# Patient Record
Sex: Female | Born: 1975 | Race: Black or African American | Hispanic: No | Marital: Married | Smoking: Never smoker
Health system: Southern US, Community
[De-identification: ages and names within clinical notes are randomized; demographics above are authoritative.]

## PROBLEM LIST (undated history)

## (undated) DIAGNOSIS — E119 Type 2 diabetes mellitus without complications: Secondary | ICD-10-CM

## (undated) DIAGNOSIS — G629 Polyneuropathy, unspecified: Secondary | ICD-10-CM

## (undated) DIAGNOSIS — E7800 Pure hypercholesterolemia, unspecified: Secondary | ICD-10-CM

## (undated) HISTORY — DX: Pure hypercholesterolemia, unspecified: E78.00

## (undated) HISTORY — PX: COLONOSCOPY: SHX174

---

## 1998-07-20 ENCOUNTER — Other Ambulatory Visit: Admission: RE | Admit: 1998-07-20 | Discharge: 1998-07-20 | Payer: Self-pay | Admitting: Family Medicine

## 2000-09-03 ENCOUNTER — Encounter: Admission: RE | Admit: 2000-09-03 | Discharge: 2000-09-03 | Payer: Self-pay | Admitting: Family Medicine

## 2000-09-03 ENCOUNTER — Encounter: Payer: Self-pay | Admitting: Family Medicine

## 2002-12-04 ENCOUNTER — Other Ambulatory Visit: Admission: RE | Admit: 2002-12-04 | Discharge: 2002-12-04 | Payer: Self-pay | Admitting: Family Medicine

## 2003-12-21 ENCOUNTER — Other Ambulatory Visit: Admission: RE | Admit: 2003-12-21 | Discharge: 2003-12-21 | Payer: Self-pay | Admitting: Family Medicine

## 2003-12-24 ENCOUNTER — Encounter: Admission: RE | Admit: 2003-12-24 | Discharge: 2003-12-24 | Payer: Self-pay | Admitting: Family Medicine

## 2005-03-29 ENCOUNTER — Other Ambulatory Visit: Admission: RE | Admit: 2005-03-29 | Discharge: 2005-03-29 | Payer: Self-pay | Admitting: Family Medicine

## 2006-07-04 ENCOUNTER — Other Ambulatory Visit: Admission: RE | Admit: 2006-07-04 | Discharge: 2006-07-04 | Payer: Self-pay | Admitting: Family Medicine

## 2007-07-18 ENCOUNTER — Other Ambulatory Visit: Admission: RE | Admit: 2007-07-18 | Discharge: 2007-07-18 | Payer: Self-pay | Admitting: Family Medicine

## 2008-07-23 ENCOUNTER — Other Ambulatory Visit: Admission: RE | Admit: 2008-07-23 | Discharge: 2008-07-23 | Payer: Self-pay | Admitting: Family Medicine

## 2010-05-31 ENCOUNTER — Other Ambulatory Visit (HOSPITAL_COMMUNITY)
Admission: RE | Admit: 2010-05-31 | Discharge: 2010-05-31 | Disposition: A | Discharge status: Home / Self Care | Payer: Self-pay | Source: Ambulatory Visit | Attending: Obstetrics and Gynecology | Admitting: Obstetrics and Gynecology

## 2010-05-31 ENCOUNTER — Other Ambulatory Visit: Payer: Self-pay | Admitting: Obstetrics and Gynecology

## 2010-05-31 DIAGNOSIS — Z01419 Encounter for gynecological examination (general) (routine) without abnormal findings: Secondary | ICD-10-CM | POA: Insufficient documentation

## 2012-08-30 ENCOUNTER — Other Ambulatory Visit: Payer: Self-pay | Admitting: Obstetrics and Gynecology

## 2012-08-30 ENCOUNTER — Other Ambulatory Visit (HOSPITAL_COMMUNITY)
Admission: RE | Admit: 2012-08-30 | Discharge: 2012-08-30 | Disposition: A | Discharge status: Home / Self Care | Payer: Self-pay | Source: Ambulatory Visit | Attending: Obstetrics and Gynecology | Admitting: Obstetrics and Gynecology

## 2012-08-30 DIAGNOSIS — Z01419 Encounter for gynecological examination (general) (routine) without abnormal findings: Secondary | ICD-10-CM | POA: Insufficient documentation

## 2012-08-30 DIAGNOSIS — Z1151 Encounter for screening for human papillomavirus (HPV): Secondary | ICD-10-CM | POA: Insufficient documentation

## 2013-05-06 ENCOUNTER — Ambulatory Visit: Payer: PRIVATE HEALTH INSURANCE | Admitting: Diagnostic Neuroimaging

## 2013-05-09 ENCOUNTER — Encounter: Payer: Self-pay | Admitting: Diagnostic Neuroimaging

## 2013-05-09 ENCOUNTER — Ambulatory Visit (INDEPENDENT_AMBULATORY_CARE_PROVIDER_SITE_OTHER): Payer: PRIVATE HEALTH INSURANCE | Admitting: Diagnostic Neuroimaging

## 2013-05-09 ENCOUNTER — Encounter (INDEPENDENT_AMBULATORY_CARE_PROVIDER_SITE_OTHER): Payer: Self-pay

## 2013-05-09 ENCOUNTER — Encounter (INDEPENDENT_AMBULATORY_CARE_PROVIDER_SITE_OTHER): Payer: Self-pay | Admitting: Radiology

## 2013-05-09 VITALS — BP 93/58 | HR 85 | Ht 63.0 in | Wt 230.0 lb

## 2013-05-09 DIAGNOSIS — R200 Anesthesia of skin: Secondary | ICD-10-CM

## 2013-05-09 DIAGNOSIS — Z0289 Encounter for other administrative examinations: Secondary | ICD-10-CM

## 2013-05-09 DIAGNOSIS — R209 Unspecified disturbances of skin sensation: Secondary | ICD-10-CM

## 2013-05-09 MED ORDER — GABAPENTIN 300 MG PO CAPS: 300.0000 mg | ORAL_CAPSULE | Freq: Two times a day (BID) | ORAL | Status: DC

## 2013-05-09 NOTE — Progress Notes (Signed)
[facility]  PATIENT: Barbara [name] DOB: February 11, 1976  REFERRING CLINICIAN: Jonette EvaLazo HISTORY FROM: patient  REASON FOR VISIT: new consult   HISTORICAL  CHIEF COMPLAINT:  Chief Complaint  Patient presents with  . Numbness    swelling, both hands    HISTORY OF PRESENT ILLNESS:   38 year old left-handed female here for evaluation of bilateral hand numbness for past 2 years (since 2013). Patient describes numbness and tingling, fingers pulling sensation in her left. Right hand. Her thumb and index finger are primarily affected. Symptoms worse when she is working on the phone or sometimes when she sleeps and wakes her up. She denies any proximal upper extremity pain or numbness. No neck pain. No headaches or lower extremities symptoms. Her patient had nerve conduction study done in [place] which I reviewed. 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 Needle EMG was not performed. Reviewing the raw data, sensory nerve response slowing is noted in bilateral median and bilateral radial sensory responses. Prolonged distal latency noted in bilateral median and bilateral ulnar motor responses.  Patient denies any significant trauma or triggering factor.  REVIEW OF SYSTEMS: Full 14 system review of systems performed and notable only for numbness skin moles.  ALLERGIES: No Known Allergies  HOME MEDICATIONS: No outpatient prescriptions prior to visit.   No facility-administered medications prior to visit.    PAST MEDICAL HISTORY: Past Medical History  Diagnosis Date  . High cholesterol     PAST SURGICAL HISTORY: History reviewed. No pertinent past surgical history.  FAMILY HISTORY: Family History  Problem Relation Age of Onset  . Heart disease Mother   . Breast cancer Mother   . Heart disease Father     SOCIAL HISTORY:  History   Social History  . Marital Status: Married    Spouse Name: Earl LitesGregory   Number of Children: 0  . Years of Education: BA   Occupational History  . other     [employer]   Social History Main Topics  . Smoking status: Never Smoker   . Smokeless tobacco: Never Used  . Alcohol Use: Yes     Comment: twice a month; occasionally  . Drug Use: No  . Sexual Activity: Not on file   Other Topics Concern  . Not on file   Social History Narrative   Patient lives at home with spouse.   Caffeine Use: 16oz soda maybe two or three times a week     PHYSICAL EXAM  Filed Vitals:   05/09/13 0952  BP: 93/58  Pulse: 85  Height: 5\' 3"  (1.6 m)  Weight: 230 lb (104.327 kg)    Not recorded    Body mass index is 40.75 kg/(m^2).  GENERAL EXAM: Patient is in no distress; well developed, nourished and groomed; neck is supple  CARDIOVASCULAR: Regular rate and rhythm, no murmurs, no carotid bruits  NEUROLOGIC: MENTAL STATUS: awake, alert, oriented to person, place and time, recent and remote memory intact, normal attention and concentration, language fluent, comprehension intact, naming intact, fund of knowledge appropriate CRANIAL NERVE: no papilledema on fundoscopic exam, pupils equal and reactive to light, visual fields full to confrontation, extraocular muscles intact, no nystagmus, facial sensation and strength symmetric, hearing intact, palate elevates symmetrically, uvula midline, shoulder shrug symmetric, tongue midline. MOTOR: normal bulk and tone, full strength in the BUE, BLE SENSORY: normal and symmetric to light touch, temperature, vibration; EXCEPT DECR PP IN LEFT DIGIT 2. POSITIVE PHALEN'S BILATERALLY. NEG TINEL'S.  COORDINATION: finger-nose-finger, fine finger movements normal REFLEXES: deep tendon reflexes present and symmetric; TRACE AT ANKLES. GAIT/STATION: narrow based gait; able to walk on toes, heels and tandem; romberg is negative    DIAGNOSTIC DATA (LABS, IMAGING, TESTING) - I reviewed patient records, labs, notes, testing and  imaging myself where available.  No results found for this basename: WBC, HGB, HCT, MCV, PLT   No results found for this basename: na, k, cl, co2, glucose, bun, creatinine, calcium, prot, albumin, ast, alt, alkphos, bilitot, gfrnonaa, gfraa   No results found for this basename: CHOL, HDL, LDLCALC, LDLDIRECT, TRIG, CHOLHDL   No results found for this basename: HGBA1C   No results found for this basename: VITAMINB12   No results found for this basename: TSH      ASSESSMENT AND PLAN  38 y.o. year old female here with bilateral hand numbness, suspicious for carpal tunnel syndrome. Outside nerve conduction study raises possibility of underlying polyneuropathy. However the interpretation and raw data are somewhat inconsistent.  Ddx: carpal tunnel syndrome vs polyneuropathy  PLAN: - EMG/NCS today (worked into schedule)  Orders Placed This Encounter  Procedures  . NCV with EMG(electromyography)   Return in about 3 months (around 08/06/2013) for with Heide Guile or Penumalli.  Suanne Marker, MD 05/09/2013, 11:18 AM Certified in Neurology, Neurophysiology and Neuroimaging  [facility] [address] [place], [place] [address] [phone]    ADDENDUM: EMG nerve conduction study was performed and confirms bilateral carpal tunnel syndrome without underlying polyneuropathy. I recommended patient try response at nighttime for the next few months. If this does not help, then we may set up referral to hand specialist for steroid injections and possible carpal tunnel release surgery. Patient agrees with plan. Follow up in 3 months.  Suanne Marker, MD 05/09/2013, 2:56 PM Certified in Neurology, Neurophysiology and Neuroimaging  [facility] [address], [address] [place], [place] [address] [phone]

## 2013-05-09 NOTE — Patient Instructions (Signed)
Try gabapentin 300mg  at bedtime for 2 weeks; can increase to twice a day if not helping.

## 2013-05-09 NOTE — Procedures (Signed)
   [facility]  NCS (NERVE CONDUCTION STUDY) WITH EMG (ELECTROMYOGRAPHY) REPORT   STUDY DATE: 05/09/13 PATIENT NAME: Barbara [name] DOB: [birth year] MRN: [id number]  ORDERING CLINICIAN: Joycelyn SchmidVikram Penumalli, MD   TECHNOLOGIST: Kaylyn LimSue Fox  ELECTROMYOGRAPHER: Glenford BayleyVikram R. Penumalli, MD  CLINICAL INFORMATION: 38 year old female with left greater than right hand numbness. Abnormal outside nerve conduction study. Evaluate for carpal tunnel syndrome vs polyneuropathy.  FINDINGS: NERVE CONDUCTION STUDY: Bilateral median motor responses have prolonged distal latencies (right 4.7 ms, left 4.3 ms, normal less than or equal to 4.2 ms), normal amplitudes, normal conduction velocities and normal F-wave latencies. Bilateral ulnar, peroneal, tibial motor responses have normal distal latencies, amplitudes, conduction velocities and F-wave latencies. Bilateral median, ulnar, sural sensory responses are normal. Bilateral median mixed nerve transcarpal responses have normal amplitudes and slow conduction velocities (right 42 m/s, left 46 m/s, normal greater than or equal to 48 m/s). Bilateral ulnar nerve transcarpal responses are normal.  NEEDLE ELECTROMYOGRAPHY: Needle examination of the left upper extremity (deltoid, biceps, triceps, flexor carpi radialis, first dorsal interosseous) demonstrates no abnormal spontaneous activity at rest and normal motor unit recruitment on exertion.  IMPRESSION:  Abnormal study demonstrating: 1. Mild bilateral median neuropathies at the wrists consistent with mild bilateral carpal tunnel syndrome. 2. No electrodiagnostic evidence of underlying, diffuse large fiber neuropathy.   INTERPRETING PHYSICIAN:  Suanne MarkerVIKRAM R. PENUMALLI, MD Certified in Neurology, Neurophysiology and Neuroimaging  [facility] [address], [address] [place], [place] [id number] [phone]

## 2013-05-12 ENCOUNTER — Encounter: Payer: PRIVATE HEALTH INSURANCE | Admitting: Diagnostic Neuroimaging

## 2013-09-05 ENCOUNTER — Other Ambulatory Visit (HOSPITAL_COMMUNITY)
Admission: RE | Admit: 2013-09-05 | Discharge: 2013-09-05 | Disposition: A | Discharge status: Home / Self Care | Payer: PRIVATE HEALTH INSURANCE | Source: Ambulatory Visit | Attending: Obstetrics and Gynecology | Admitting: Obstetrics and Gynecology

## 2013-09-05 ENCOUNTER — Other Ambulatory Visit: Payer: Self-pay | Admitting: Obstetrics and Gynecology

## 2013-09-05 DIAGNOSIS — Z1231 Encounter for screening mammogram for malignant neoplasm of breast: Secondary | ICD-10-CM

## 2013-09-05 DIAGNOSIS — Z113 Encounter for screening for infections with a predominantly sexual mode of transmission: Secondary | ICD-10-CM | POA: Insufficient documentation

## 2013-09-05 DIAGNOSIS — Z124 Encounter for screening for malignant neoplasm of cervix: Secondary | ICD-10-CM | POA: Insufficient documentation

## 2013-09-08 LAB — CYTOLOGY - PAP

## 2013-09-12 ENCOUNTER — Ambulatory Visit: Payer: Self-pay

## 2014-09-25 ENCOUNTER — Other Ambulatory Visit (HOSPITAL_COMMUNITY)
Admission: RE | Admit: 2014-09-25 | Discharge: 2014-09-25 | Disposition: A | Discharge status: Home / Self Care | Payer: PRIVATE HEALTH INSURANCE | Source: Ambulatory Visit | Attending: Obstetrics and Gynecology | Admitting: Obstetrics and Gynecology

## 2014-09-25 ENCOUNTER — Other Ambulatory Visit: Payer: Self-pay | Admitting: Obstetrics and Gynecology

## 2014-09-25 DIAGNOSIS — Z01419 Encounter for gynecological examination (general) (routine) without abnormal findings: Secondary | ICD-10-CM | POA: Diagnosis not present

## 2014-09-28 LAB — CYTOLOGY - PAP

## 2014-10-09 ENCOUNTER — Other Ambulatory Visit: Payer: Self-pay

## 2014-10-09 DIAGNOSIS — Z1231 Encounter for screening mammogram for malignant neoplasm of breast: Secondary | ICD-10-CM

## 2014-10-09 DIAGNOSIS — Z803 Family history of malignant neoplasm of breast: Secondary | ICD-10-CM

## 2014-10-16 ENCOUNTER — Ambulatory Visit
Admission: RE | Admit: 2014-10-16 | Discharge: 2014-10-16 | Disposition: A | Discharge status: Home / Self Care | Payer: PRIVATE HEALTH INSURANCE | Source: Ambulatory Visit

## 2014-10-16 DIAGNOSIS — Z1231 Encounter for screening mammogram for malignant neoplasm of breast: Secondary | ICD-10-CM

## 2014-10-16 DIAGNOSIS — Z803 Family history of malignant neoplasm of breast: Secondary | ICD-10-CM

## 2015-09-24 ENCOUNTER — Other Ambulatory Visit (HOSPITAL_COMMUNITY)
Admission: RE | Admit: 2015-09-24 | Discharge: 2015-09-24 | Disposition: A | Discharge status: Home / Self Care | Payer: PRIVATE HEALTH INSURANCE | Source: Ambulatory Visit | Attending: Obstetrics and Gynecology | Admitting: Obstetrics and Gynecology

## 2015-09-24 ENCOUNTER — Other Ambulatory Visit: Payer: Self-pay | Admitting: Obstetrics and Gynecology

## 2015-09-24 DIAGNOSIS — Z1151 Encounter for screening for human papillomavirus (HPV): Secondary | ICD-10-CM | POA: Insufficient documentation

## 2015-09-24 DIAGNOSIS — Z01419 Encounter for gynecological examination (general) (routine) without abnormal findings: Secondary | ICD-10-CM | POA: Diagnosis present

## 2015-09-24 DIAGNOSIS — Z113 Encounter for screening for infections with a predominantly sexual mode of transmission: Secondary | ICD-10-CM | POA: Diagnosis present

## 2015-09-29 LAB — CYTOLOGY - PAP

## 2016-02-28 DIAGNOSIS — R7303 Prediabetes: Secondary | ICD-10-CM | POA: Diagnosis not present

## 2016-02-28 DIAGNOSIS — Z23 Encounter for immunization: Secondary | ICD-10-CM | POA: Diagnosis not present

## 2016-05-14 DIAGNOSIS — A5901 Trichomonal vulvovaginitis: Secondary | ICD-10-CM | POA: Diagnosis not present

## 2016-05-14 DIAGNOSIS — N898 Other specified noninflammatory disorders of vagina: Secondary | ICD-10-CM | POA: Diagnosis not present

## 2016-09-12 DIAGNOSIS — R7303 Prediabetes: Secondary | ICD-10-CM | POA: Diagnosis not present

## 2016-09-12 DIAGNOSIS — Z0001 Encounter for general adult medical examination with abnormal findings: Secondary | ICD-10-CM | POA: Diagnosis not present

## 2016-09-12 DIAGNOSIS — Z13220 Encounter for screening for lipoid disorders: Secondary | ICD-10-CM | POA: Diagnosis not present

## 2016-10-06 ENCOUNTER — Other Ambulatory Visit: Payer: Self-pay | Admitting: Obstetrics and Gynecology

## 2016-10-06 DIAGNOSIS — B009 Herpesviral infection, unspecified: Secondary | ICD-10-CM | POA: Diagnosis not present

## 2016-10-06 DIAGNOSIS — Z113 Encounter for screening for infections with a predominantly sexual mode of transmission: Secondary | ICD-10-CM | POA: Diagnosis not present

## 2016-10-06 DIAGNOSIS — Z01419 Encounter for gynecological examination (general) (routine) without abnormal findings: Secondary | ICD-10-CM | POA: Diagnosis not present

## 2016-10-06 DIAGNOSIS — Z1231 Encounter for screening mammogram for malignant neoplasm of breast: Secondary | ICD-10-CM

## 2016-10-16 ENCOUNTER — Encounter: Payer: Self-pay | Admitting: Radiology

## 2016-10-16 ENCOUNTER — Ambulatory Visit
Admission: RE | Admit: 2016-10-16 | Discharge: 2016-10-16 | Disposition: A | Discharge status: Home / Self Care | Payer: BLUE CROSS/BLUE SHIELD | Source: Ambulatory Visit | Attending: Obstetrics and Gynecology | Admitting: Obstetrics and Gynecology

## 2016-10-16 DIAGNOSIS — Z1231 Encounter for screening mammogram for malignant neoplasm of breast: Secondary | ICD-10-CM

## 2016-12-13 DIAGNOSIS — Z23 Encounter for immunization: Secondary | ICD-10-CM | POA: Diagnosis not present

## 2016-12-13 DIAGNOSIS — E119 Type 2 diabetes mellitus without complications: Secondary | ICD-10-CM | POA: Diagnosis not present

## 2017-01-01 DIAGNOSIS — N9089 Other specified noninflammatory disorders of vulva and perineum: Secondary | ICD-10-CM | POA: Diagnosis not present

## 2017-03-21 DIAGNOSIS — E119 Type 2 diabetes mellitus without complications: Secondary | ICD-10-CM | POA: Diagnosis not present

## 2017-09-04 DIAGNOSIS — W57XXXA Bitten or stung by nonvenomous insect and other nonvenomous arthropods, initial encounter: Secondary | ICD-10-CM | POA: Diagnosis not present

## 2017-09-04 DIAGNOSIS — Y929 Unspecified place or not applicable: Secondary | ICD-10-CM | POA: Diagnosis not present

## 2017-09-04 DIAGNOSIS — S70361A Insect bite (nonvenomous), right thigh, initial encounter: Secondary | ICD-10-CM | POA: Diagnosis not present

## 2017-09-06 DIAGNOSIS — L810 Postinflammatory hyperpigmentation: Secondary | ICD-10-CM | POA: Diagnosis not present

## 2017-09-06 DIAGNOSIS — L853 Xerosis cutis: Secondary | ICD-10-CM | POA: Diagnosis not present

## 2017-09-07 ENCOUNTER — Other Ambulatory Visit: Payer: Self-pay | Admitting: Obstetrics and Gynecology

## 2017-09-07 DIAGNOSIS — Z1231 Encounter for screening mammogram for malignant neoplasm of breast: Secondary | ICD-10-CM

## 2017-10-08 DIAGNOSIS — Z01419 Encounter for gynecological examination (general) (routine) without abnormal findings: Secondary | ICD-10-CM | POA: Diagnosis not present

## 2017-10-18 ENCOUNTER — Ambulatory Visit
Admission: RE | Admit: 2017-10-18 | Discharge: 2017-10-18 | Disposition: A | Discharge status: Home / Self Care | Payer: BLUE CROSS/BLUE SHIELD | Source: Ambulatory Visit | Attending: Obstetrics and Gynecology | Admitting: Obstetrics and Gynecology

## 2017-10-18 DIAGNOSIS — Z1231 Encounter for screening mammogram for malignant neoplasm of breast: Secondary | ICD-10-CM | POA: Diagnosis not present

## 2018-04-23 DIAGNOSIS — J0120 Acute ethmoidal sinusitis, unspecified: Secondary | ICD-10-CM | POA: Diagnosis not present

## 2018-10-30 DIAGNOSIS — E119 Type 2 diabetes mellitus without complications: Secondary | ICD-10-CM | POA: Diagnosis not present

## 2018-10-30 DIAGNOSIS — Z0001 Encounter for general adult medical examination with abnormal findings: Secondary | ICD-10-CM | POA: Diagnosis not present

## 2018-10-30 DIAGNOSIS — Z13220 Encounter for screening for lipoid disorders: Secondary | ICD-10-CM | POA: Diagnosis not present

## 2018-10-30 DIAGNOSIS — Z23 Encounter for immunization: Secondary | ICD-10-CM | POA: Diagnosis not present

## 2018-11-22 ENCOUNTER — Other Ambulatory Visit (HOSPITAL_COMMUNITY)
Admission: RE | Admit: 2018-11-22 | Discharge: 2018-11-22 | Disposition: A | Discharge status: Home / Self Care | Payer: BLUE CROSS/BLUE SHIELD | Source: Ambulatory Visit | Attending: Obstetrics and Gynecology | Admitting: Obstetrics and Gynecology

## 2018-11-22 ENCOUNTER — Other Ambulatory Visit: Payer: Self-pay | Admitting: Obstetrics and Gynecology

## 2018-11-22 DIAGNOSIS — Z01419 Encounter for gynecological examination (general) (routine) without abnormal findings: Secondary | ICD-10-CM | POA: Insufficient documentation

## 2018-11-22 DIAGNOSIS — Z1231 Encounter for screening mammogram for malignant neoplasm of breast: Secondary | ICD-10-CM

## 2018-11-27 LAB — CYTOLOGY - PAP
Diagnosis: NEGATIVE
HPV: NOT DETECTED

## 2019-01-09 ENCOUNTER — Ambulatory Visit
Admission: RE | Admit: 2019-01-09 | Discharge: 2019-01-09 | Disposition: A | Discharge status: Home / Self Care | Payer: BC Managed Care – PPO | Source: Ambulatory Visit | Attending: Obstetrics and Gynecology | Admitting: Obstetrics and Gynecology

## 2019-01-09 ENCOUNTER — Other Ambulatory Visit: Payer: Self-pay

## 2019-01-09 DIAGNOSIS — Z1231 Encounter for screening mammogram for malignant neoplasm of breast: Secondary | ICD-10-CM

## 2019-01-16 DIAGNOSIS — E7800 Pure hypercholesterolemia, unspecified: Secondary | ICD-10-CM | POA: Diagnosis not present

## 2019-05-23 DIAGNOSIS — Z03818 Encounter for observation for suspected exposure to other biological agents ruled out: Secondary | ICD-10-CM | POA: Diagnosis not present

## 2019-05-23 DIAGNOSIS — Z20828 Contact with and (suspected) exposure to other viral communicable diseases: Secondary | ICD-10-CM | POA: Diagnosis not present

## 2019-07-01 DIAGNOSIS — N6081 Other benign mammary dysplasias of right breast: Secondary | ICD-10-CM | POA: Diagnosis not present

## 2019-07-01 DIAGNOSIS — L732 Hidradenitis suppurativa: Secondary | ICD-10-CM | POA: Diagnosis not present

## 2019-07-09 DIAGNOSIS — L732 Hidradenitis suppurativa: Secondary | ICD-10-CM | POA: Diagnosis not present

## 2019-10-15 DIAGNOSIS — L732 Hidradenitis suppurativa: Secondary | ICD-10-CM | POA: Diagnosis not present

## 2019-12-01 DIAGNOSIS — Z01419 Encounter for gynecological examination (general) (routine) without abnormal findings: Secondary | ICD-10-CM | POA: Diagnosis not present

## 2020-02-18 ENCOUNTER — Other Ambulatory Visit: Payer: Self-pay | Admitting: Obstetrics and Gynecology

## 2020-02-18 DIAGNOSIS — Z1231 Encounter for screening mammogram for malignant neoplasm of breast: Secondary | ICD-10-CM

## 2020-02-19 ENCOUNTER — Ambulatory Visit
Admission: RE | Admit: 2020-02-19 | Discharge: 2020-02-19 | Disposition: A | Discharge status: Home / Self Care | Payer: BC Managed Care – PPO | Source: Ambulatory Visit | Attending: Obstetrics and Gynecology | Admitting: Obstetrics and Gynecology

## 2020-02-19 ENCOUNTER — Other Ambulatory Visit: Payer: Self-pay

## 2020-02-19 DIAGNOSIS — Z1231 Encounter for screening mammogram for malignant neoplasm of breast: Secondary | ICD-10-CM

## 2020-03-26 DIAGNOSIS — R059 Cough, unspecified: Secondary | ICD-10-CM | POA: Diagnosis not present

## 2020-03-26 DIAGNOSIS — Z1152 Encounter for screening for COVID-19: Secondary | ICD-10-CM | POA: Diagnosis not present

## 2020-09-16 DIAGNOSIS — E119 Type 2 diabetes mellitus without complications: Secondary | ICD-10-CM | POA: Diagnosis not present

## 2020-09-16 DIAGNOSIS — Z13220 Encounter for screening for lipoid disorders: Secondary | ICD-10-CM | POA: Diagnosis not present

## 2020-09-16 DIAGNOSIS — E559 Vitamin D deficiency, unspecified: Secondary | ICD-10-CM | POA: Diagnosis not present

## 2020-09-16 DIAGNOSIS — Z0000 Encounter for general adult medical examination without abnormal findings: Secondary | ICD-10-CM | POA: Diagnosis not present

## 2020-12-28 DIAGNOSIS — E119 Type 2 diabetes mellitus without complications: Secondary | ICD-10-CM | POA: Diagnosis not present

## 2021-01-04 DIAGNOSIS — Z13220 Encounter for screening for lipoid disorders: Secondary | ICD-10-CM | POA: Diagnosis not present

## 2021-01-04 DIAGNOSIS — E119 Type 2 diabetes mellitus without complications: Secondary | ICD-10-CM | POA: Diagnosis not present

## 2021-01-10 DIAGNOSIS — Z01419 Encounter for gynecological examination (general) (routine) without abnormal findings: Secondary | ICD-10-CM | POA: Diagnosis not present

## 2021-03-25 ENCOUNTER — Other Ambulatory Visit: Payer: Self-pay | Admitting: Obstetrics and Gynecology

## 2021-03-25 DIAGNOSIS — Z1231 Encounter for screening mammogram for malignant neoplasm of breast: Secondary | ICD-10-CM

## 2021-03-28 ENCOUNTER — Ambulatory Visit
Admission: RE | Admit: 2021-03-28 | Discharge: 2021-03-28 | Disposition: A | Discharge status: Home / Self Care | Payer: BC Managed Care – PPO | Source: Ambulatory Visit | Attending: Obstetrics and Gynecology | Admitting: Obstetrics and Gynecology

## 2021-03-28 DIAGNOSIS — Z1231 Encounter for screening mammogram for malignant neoplasm of breast: Secondary | ICD-10-CM | POA: Diagnosis not present

## 2021-05-08 DIAGNOSIS — M545 Low back pain, unspecified: Secondary | ICD-10-CM | POA: Diagnosis not present

## 2021-07-08 DIAGNOSIS — K5730 Diverticulosis of large intestine without perforation or abscess without bleeding: Secondary | ICD-10-CM | POA: Diagnosis not present

## 2021-07-08 DIAGNOSIS — K648 Other hemorrhoids: Secondary | ICD-10-CM | POA: Diagnosis not present

## 2021-07-08 DIAGNOSIS — Z800 Family history of malignant neoplasm of digestive organs: Secondary | ICD-10-CM | POA: Diagnosis not present

## 2021-07-08 DIAGNOSIS — K635 Polyp of colon: Secondary | ICD-10-CM | POA: Diagnosis not present

## 2021-07-08 DIAGNOSIS — Z1211 Encounter for screening for malignant neoplasm of colon: Secondary | ICD-10-CM | POA: Diagnosis not present

## 2021-09-15 DIAGNOSIS — M545 Low back pain, unspecified: Secondary | ICD-10-CM | POA: Diagnosis not present
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
## 2021-09-21 ENCOUNTER — Encounter: Payer: Self-pay | Admitting: Physical Therapy

## 2021-09-21 ENCOUNTER — Ambulatory Visit: Payer: BC Managed Care – PPO | Attending: Family Medicine | Admitting: Physical Therapy

## 2021-09-21 DIAGNOSIS — R252 Cramp and spasm: Secondary | ICD-10-CM | POA: Insufficient documentation

## 2021-09-21 DIAGNOSIS — M5459 Other low back pain: Secondary | ICD-10-CM | POA: Insufficient documentation

## 2021-10-04 ENCOUNTER — Ambulatory Visit: Payer: BC Managed Care – PPO

## 2021-10-04 DIAGNOSIS — M5459 Other low back pain: Secondary | ICD-10-CM

## 2021-10-04 DIAGNOSIS — R252 Cramp and spasm: Secondary | ICD-10-CM | POA: Diagnosis not present

## 2021-10-04 NOTE — Therapy (Signed)
OUTPATIENT PHYSICAL THERAPY TREATMENT NOTE   Patient Name: Barbara [name] MRN: [id number] DOB:09-09-1975, 46 y.o., female Today's Date: 10/04/2021  PCP: Dr. Duane Lope REFERRING PROVIDER: Dr. Duane Lope  END OF SESSION:   PT End of Session - 10/04/21 1027     Visit Number 2    Number of Visits 12    Date for PT Re-Evaluation 11/02/21    Authorization Type BCBS    PT Start Time 0940    PT Stop Time 1020    PT Time Calculation (min) 40 min    Activity Tolerance Patient tolerated treatment well    Behavior During Therapy [facility] for tasks assessed/performed             Past Medical History:  Diagnosis Date   High cholesterol    History reviewed. No pertinent surgical history. There are no problems to display for this patient.   REFERRING DIAG: Low back pain   THERAPY DIAG:  Other low back pain  Cramp and spasm  Rationale for Evaluation and Treatment Rehabilitation  SUBJECTIVE:                                                                                                                                                                                    SUBJECTIVE STATEMENT: Pt reports her L low back is about the same. Pain is limited to the L low back, not extending down the R lateral hip   PERTINENT HISTORY:  No other episodes of back pain   Diabetes   PAIN:  Are you having pain? Yes: NPRS scale: 1-2/10 Pain location: L low lumbar , waist  wrapping to L hip ant  Pain description: dull ache Aggravating factors: unsure  Relieving factors: lay down, wait, take OTC     PRECAUTIONS: None   WEIGHT BEARING RESTRICTIONS No   FALLS:  Has patient fallen in last 6 months? 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    PLOF: Independent   PATIENT GOALS : Pt wants to  be able to prevent this from happening again.  I just don't want to hurt.      OBJECTIVE: (objective measures completed at initial evaluation unless otherwise dated)   DIAGNOSTIC FINDINGS:  No XR or MRI    PATIENT SURVEYS:  FOTO 47%   SCREENING FOR RED FLAGS: Bowel or bladder incontinence: No Spinal tumors: No Cauda equina syndrome: No Compression fracture: No Abdominal aneurysm: No   COGNITION:  Overall cognitive status: Within functional limits for tasks assessed                          SENSATION: WFL     POSTURE: rounded shoulders   PALPATION: Pain along L L4-L5 and wrapping to L hip, pelvis  High L hip    LUMBAR ROM:    Active  A/PROM  eval  Flexion WNL   Extension 75% pain on L   Right lateral flexion WFL   Left lateral flexion Pain but WFL   Right rotation Pain min   Left rotation Inc pain, WFL    (Blank rows = not tested)   LOWER EXTREMITY ROM:      Active  Right eval Left eval  Hip flexion WNL WNL   Hip extension      Hip abduction      Hip adduction      Hip internal rotation Tight  tight  Hip external rotation No pain  Painful   Knee flexion      Knee extension      Ankle dorsiflexion      Ankle plantarflexion      Ankle inversion      Ankle eversion       (Blank rows = not tested)   LOWER EXTREMITY MMT:     MMT Right eval Left eval  Hip flexion WNL 4+, Pain   Hip extension      Hip abduction   4+  Hip adduction      Hip internal rotation      Hip external rotation      Knee flexion 4+ 4+  Knee extension 5 5  Ankle dorsiflexion      Ankle plantarflexion      Ankle inversion      Ankle eversion       (Blank rows = not tested)   LUMBAR SPECIAL TESTS:  Straight leg raise test: Negative and Slump test: Negative Pain with LLE SLR      FUNCTIONAL TESTS:  5 times sit to stand: NT today    GAIT: Distance walked: 150 Assistive device utilized: None Level of assistance: Complete Independence Comments: no deviations         TODAY'S TREATMENT  [facility] Adult PT Treatment:                                                DATE: 10/04/21 Therapeutic Exercise: QL stretch in standing 2x20 L stretch in standing and for L side Seated forward and lateral streches c Swiss ball x4 20" Bridging x10 10 sec Hip abd x10 5 sec BluTB Manual Therapy: STM c MTPR to the L low back paraspinals and QL Self Care: Instruction in tennis ball massage at wall. Pt completed correctly  Eval Treatment: PT eval and treatment plan established, HEP      PATIENT EDUCATION:  Education details: PT, differential diag, QL muscle (quadratus lumborum), HEP, POC, Dry needling  Person educated: Patient Education method: Explanation, Verbal cues, and Handouts Education comprehension: verbalized understanding and returned demonstration     HOME EXERCISE PROGRAM: Access Code: [email or web address] URL: [email or web address] Date: 10/04/2021 Prepared by: Gar Ponto  Exercises - Supine Bridge  - 1-2 x daily - 7 x weekly - 1 sets - 10 reps - 10 hold - Hooklying Clamshell with Resistance  -  1-2 x daily - 7 x weekly - 1 sets - 10 reps - 5 hold - Seated Flexion Stretch with Swiss Ball  - 2 x daily - 7 x weekly - 1 sets - 3 reps - 20 hold - Seated Thoracic Flexion and Rotation with Swiss Ball  - 2 x daily - 7 x weekly - 1 sets - 3 reps - 20 hold   ASSESSMENT:   CLINICAL IMPRESSION: Pt was completed to low back flexibility with emphasis of the L low back. Pt reported STM, MTPR, and self tennis ball massage felt good to her L low back. Pt reports being consistent with her HEP which helps to ease the discomfort temporarily. Additionally, low back and hip strengthening therex were started. Pt tolerated the session without adverse effects. Pt will continue to benefit from skilled PT to address deficits to optimize function with less pain. Pt notes she is going to have a massage to her low back later today.   OBJECTIVE IMPAIRMENTS decreased  mobility, decreased ROM, decreased strength, increased fascial restrictions, increased muscle spasms, impaired flexibility, postural dysfunction, obesity, and pain.    ACTIVITY LIMITATIONS standing   PARTICIPATION LIMITATIONS: community activity   PERSONAL FACTORS Fitness are also affecting patient's functional outcome.    REHAB POTENTIAL: Excellent   CLINICAL DECISION MAKING: Stable/uncomplicated   EVALUATION COMPLEXITY: Low     GOALS:   LONG TERM GOALS: Target date: 11/02/2021   Pt will be I with HEP for trunk mobility, core strength  Baseline:  Goal status: INITIAL   2.  Pt will be able to stand, walk and move as needed without pain aggravation  Baseline:  Goal status: INITIAL   3.  Pt will be able to understand body mechanics and posture for more challenging home and work tasks.  Baseline:  Goal status: INITIAL   4.  Pt will walk 3 days a week for exercise (or exercise of her choice) in order to improve overall health Baseline:  Goal status: INITIAL   5.  FOTO score will improve to 68% or better upon discharge  Baseline:  Goal status: INITIAL     PLAN: PT FREQUENCY: 1x/week   PT DURATION: 6 weeks   PLANNED INTERVENTIONS: Therapeutic exercises, Therapeutic activity, Neuromuscular re-education, Balance training, Gait training, Patient/Family education, Joint mobilization, Electrical stimulation, Spinal mobilization, Cryotherapy, Moist heat, Manual therapy, and Re-evaluation.   PLAN FOR NEXT SESSION: check HEP, dry needling/manual L lumbar     Evert Wenrich MS, PT 10/04/21 1:35 PM

## 2021-10-10 NOTE — Therapy (Deleted)
OUTPATIENT PHYSICAL THERAPY TREATMENT NOTE   Patient Name: Barbara [name] MRN: [id number] DOB:11-11-1975, 46 y.o., female Today's Date: 10/10/2021  PCP: Dr. Duane Lope REFERRING PROVIDER: Dr. Duane Lope  END OF SESSION:     Past Medical History:  Diagnosis Date   High cholesterol    No past surgical history on file. There are no problems to display for this patient.   REFERRING DIAG: Low back pain   THERAPY DIAG:  No diagnosis found.  Rationale for Evaluation and Treatment Rehabilitation  SUBJECTIVE:                                                                                                                                                                                    SUBJECTIVE STATEMENT: Pt reports her L low back is about the same. Pain is limited to the L low back, not extending down the R lateral hip   PERTINENT HISTORY:  No other episodes of back pain   Diabetes   PAIN:  Are you having pain? Yes: NPRS scale: 1-2/10 Pain location: L low lumbar , waist  wrapping to L hip ant  Pain description: dull ache Aggravating factors: unsure  Relieving factors: lay down, wait, take OTC     PRECAUTIONS: None   WEIGHT BEARING RESTRICTIONS No   FALLS:  Has patient fallen in last 6 months? 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    PLOF: Independent   PATIENT GOALS : Pt wants to be able to prevent this from happening again.  I just don't want to hurt.      OBJECTIVE: (objective measures completed at initial evaluation unless otherwise dated)   DIAGNOSTIC FINDINGS:  No XR or MRI    PATIENT SURVEYS:  FOTO 47%   SCREENING FOR RED FLAGS: Bowel or bladder incontinence: No Spinal tumors: No Cauda equina syndrome: No Compression fracture: No Abdominal  aneurysm: No   COGNITION:           Overall cognitive status: Within functional limits for tasks assessed                          SENSATION: WFL     POSTURE: rounded shoulders   PALPATION: Pain along L L4-L5 and wrapping to L hip, pelvis  High L hip    LUMBAR ROM:    Active  A/PROM  eval  Flexion WNL   Extension 75% pain on L   Right  lateral flexion WFL   Left lateral flexion Pain but WFL   Right rotation Pain min   Left rotation Inc pain, WFL    (Blank rows = not tested)   LOWER EXTREMITY ROM:      Active  Right eval Left eval  Hip flexion WNL WNL   Hip extension      Hip abduction      Hip adduction      Hip internal rotation Tight  tight  Hip external rotation No pain  Painful   Knee flexion      Knee extension      Ankle dorsiflexion      Ankle plantarflexion      Ankle inversion      Ankle eversion       (Blank rows = not tested)   LOWER EXTREMITY MMT:     MMT Right eval Left eval  Hip flexion WNL 4+, Pain   Hip extension      Hip abduction   4+  Hip adduction      Hip internal rotation      Hip external rotation      Knee flexion 4+ 4+  Knee extension 5 5  Ankle dorsiflexion      Ankle plantarflexion      Ankle inversion      Ankle eversion       (Blank rows = not tested)   LUMBAR SPECIAL TESTS:  Straight leg raise test: Negative and Slump test: Negative Pain with LLE SLR      FUNCTIONAL TESTS:  5 times sit to stand: NT today    GAIT: Distance walked: 150 Assistive device utilized: None Level of assistance: Complete Independence Comments: no deviations        TODAY'S TREATMENT    [facility] Adult PT Treatment:                                                DATE: 10/10/21 Therapeutic Exercise: *** Manual Therapy: *** Neuromuscular re-ed: *** Therapeutic Activity: *** Modalities: *** Self Care: ***   [facility] Adult PT Treatment:                                                DATE: 10/04/21 Therapeutic Exercise: QL stretch in  standing 2x20 L stretch in standing and for L side Seated forward and lateral streches c Swiss ball x4 20" Bridging x10 10 sec Hip abd x10 5 sec BluTB Manual Therapy: STM c MTPR to the L low back paraspinals and QL Self Care: Instruction in tennis ball massage at wall. Pt completed correctly  Eval Treatment: PT eval and treatment plan established, HEP      PATIENT EDUCATION:  Education details: PT, differential diag, QL muscle (quadratus lumborum), HEP, POC, Dry needling  Person educated: Patient Education method: Explanation, Verbal cues, and Handouts Education comprehension: verbalized understanding and returned demonstration     HOME EXERCISE PROGRAM: Access Code: [email or web address] URL: [email or web address] Date: 10/04/2021 Prepared by: Joellyn Rued  Exercises - Supine Bridge  - 1-2 x daily - 7 x weekly - 1 sets - 10 reps - 10 hold - Hooklying Clamshell with Resistance  - 1-2 x daily - 7 x weekly - 1 sets -  10 reps - 5 hold - Seated Flexion Stretch with Swiss Ball  - 2 x daily - 7 x weekly - 1 sets - 3 reps - 20 hold - Seated Thoracic Flexion and Rotation with Swiss Ball  - 2 x daily - 7 x weekly - 1 sets - 3 reps - 20 hold   ASSESSMENT:   CLINICAL IMPRESSION: Pt was completed to low back flexibility with emphasis of the L low back. Pt reported STM, MTPR, and self tennis ball massage felt good to her L low back. Pt reports being consistent with her HEP which helps to ease the discomfort temporarily. Additionally, low back and hip strengthening therex were started. Pt tolerated the session without adverse effects. Pt will continue to benefit from skilled PT to address deficits to optimize function with less pain. Pt notes she is going to have a massage to her low back later today.   OBJECTIVE IMPAIRMENTS decreased mobility, decreased ROM, decreased strength, increased fascial restrictions, increased muscle spasms, impaired flexibility, postural dysfunction, obesity, and  pain.    ACTIVITY LIMITATIONS standing   PARTICIPATION LIMITATIONS: community activity   PERSONAL FACTORS Fitness are also affecting patient's functional outcome.    REHAB POTENTIAL: Excellent   CLINICAL DECISION MAKING: Stable/uncomplicated   EVALUATION COMPLEXITY: Low     GOALS:   LONG TERM GOALS: Target date: 11/02/2021   Pt will be I with HEP for trunk mobility, core strength  Baseline:  Goal status: INITIAL   2.  Pt will be able to stand, walk and move as needed without pain aggravation  Baseline:  Goal status: INITIAL   3.  Pt will be able to understand body mechanics and posture for more challenging home and work tasks.  Baseline:  Goal status: INITIAL   4.  Pt will walk 3 days a week for exercise (or exercise of her choice) in order to improve overall health Baseline:  Goal status: INITIAL   5.  FOTO score will improve to 68% or better upon discharge  Baseline:  Goal status: INITIAL     PLAN: PT FREQUENCY: 1x/week   PT DURATION: 6 weeks   PLANNED INTERVENTIONS: Therapeutic exercises, Therapeutic activity, Neuromuscular re-education, Balance training, Gait training, Patient/Family education, Joint mobilization, Electrical stimulation, Spinal mobilization, Cryotherapy, Moist heat, Manual therapy, and Re-evaluation.   PLAN FOR NEXT SESSION: check HEP, dry needling/manual L lumbar     Allen Ralls MS, PT 10/10/21 8:40 AM

## 2021-10-11 ENCOUNTER — Ambulatory Visit: Payer: BC Managed Care – PPO | Admitting: Physical Therapy

## 2021-10-18 ENCOUNTER — Ambulatory Visit: Payer: BC Managed Care – PPO | Attending: Family Medicine

## 2021-10-18 DIAGNOSIS — M5459 Other low back pain: Secondary | ICD-10-CM | POA: Diagnosis not present

## 2021-10-18 DIAGNOSIS — R252 Cramp and spasm: Secondary | ICD-10-CM | POA: Diagnosis not present

## 2021-10-18 NOTE — Therapy (Addendum)
OUTPATIENT PHYSICAL THERAPY TREATMENT NOTE DISCHARGE   Patient Name: Barbara [name] MRN: [id number] DOB:10-04-1975, 46 y.o., female Today's Date: 10/18/2021  PCP: Dr. Melinda Crutch REFERRING PROVIDER: Dr. Melinda Crutch  END OF SESSION:   PT End of Session - 10/18/21 0917     Visit Number 3    Number of Visits 12    Date for PT Re-Evaluation 11/02/21    Authorization Type [employer]    PT Start Time 0720    PT Stop Time 0805    PT Time Calculation (min) 45 min    Activity Tolerance Patient tolerated treatment well    Behavior During Therapy [facility] for tasks assessed/performed              Past Medical History:  Diagnosis Date   High cholesterol    History reviewed. No pertinent surgical history. There are no problems to display for this patient.   REFERRING DIAG: Low back pain   THERAPY DIAG:  Other low back pain  Cramp and spasm  Rationale for Evaluation and Treatment Rehabilitation  SUBJECTIVE:                                                                                                                                                                                    SUBJECTIVE STATEMENT: Pt reports her low back is doing better. She has been doing the HEP and using tennis and they have been helping.  PERTINENT HISTORY:  No other episodes of back pain   Diabetes   PAIN:  Are you having pain? 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  I just don't want to hurt.      OBJECTIVE: (objective measures completed at initial evaluation unless otherwise dated)   DIAGNOSTIC FINDINGS:  No XR or MRI    PATIENT SURVEYS:  FOTO 47%   SCREENING FOR  RED FLAGS: Bowel or bladder incontinence: No Spinal tumors: No Cauda equina syndrome: No Compression fracture: No Abdominal aneurysm: No   COGNITION:           Overall cognitive status: Within functional limits for tasks assessed                          SENSATION: WFL     POSTURE: rounded  shoulders   PALPATION: Pain along L L4-L5 and wrapping to L hip, pelvis  High L hip    LUMBAR ROM:    Active  A/PROM  eval  Flexion WNL   Extension 75% pain on L   Right lateral flexion WFL   Left lateral flexion Pain but WFL   Right rotation Pain min   Left rotation Inc pain, WFL    (Blank rows = not tested)   LOWER EXTREMITY ROM:      Active  Right eval Left eval  Hip flexion WNL WNL   Hip extension      Hip abduction      Hip adduction      Hip internal rotation Tight  tight  Hip external rotation No pain  Painful   Knee flexion      Knee extension      Ankle dorsiflexion      Ankle plantarflexion      Ankle inversion      Ankle eversion       (Blank rows = not tested)   LOWER EXTREMITY MMT:     MMT Right eval Left eval  Hip flexion WNL 4+, Pain   Hip extension      Hip abduction   4+  Hip adduction      Hip internal rotation      Hip external rotation      Knee flexion 4+ 4+  Knee extension 5 5  Ankle dorsiflexion      Ankle plantarflexion      Ankle inversion      Ankle eversion       (Blank rows = not tested)   LUMBAR SPECIAL TESTS:  Straight leg raise test: Negative and Slump test: Negative Pain with LLE SLR      FUNCTIONAL TESTS:  5 times sit to stand: NT today    GAIT: Distance walked: 150 Assistive device utilized: None Level of assistance: Complete Independence Comments: no deviations        TODAY'S TREATMENT  [facility] Adult PT Treatment:                                                DATE: 10/18/21 Therapeutic Exercise: NuStep 4 mins L4 UE/LE Manual Therapy: STM/DTM to bilat lumbar paraspinals Skilled palpation to the lumbar  paraspinals for taut muscle and TrP ID Modalities: Estim with TPDN see below Self Care: Instructions for post TPDN care   Trigger Point Dry Needling Treatment: Pre-treatment instruction: Patient instructed on dry needling rationale, procedures, and possible side effects including pain during treatment (achy,cramping feeling), bruising, drop of blood, lightheadedness, nausea, sweating. Patient Consent Given: Yes Education handout provided: Yes Muscles treated: Lumbar paraspinal and multifidi L2-L4  Needle size and number: .30x25m x 4 Electrical stimulation performed: Yes Parameters:  Micro: 60uA, intensity as tolerated; Milli: 261m intensity as tolerated   Treatment response/outcome: Twitch response elicited and Palpable decrease in muscle tension Post-treatment instructions: Patient instructed to expect possible mild to moderate muscle soreness later today and/or tomorrow. Patient instructed in methods to reduce muscle soreness and to continue prescribed HEP. If patient was dry needled over the lung field, patient was instructed on signs and symptoms of pneumothorax and, however unlikely, to see immediate medical attention should they occur. Patient was also educated on signs and symptoms of infection and to seek medical attention  should they occur. Patient verbalized understanding of these instructions and education.    [facility] Adult PT Treatment:                                                DATE: 10/04/21 Therapeutic Exercise: QL stretch in standing 2x20 L stretch in standing and for L side Seated forward and lateral streches c Swiss ball x4 20" Bridging x10 10 sec Hip abd x10 5 sec BluTB Manual Therapy: STM c MTPR to the L low back paraspinals and QL Self Care: Instruction in tennis ball massage at wall. Pt completed correctly  Eval Treatment: PT eval and treatment plan established, HEP      PATIENT EDUCATION:  Education details: PT, differential diag, QL muscle (quadratus  lumborum), HEP, POC, Dry needling  Person educated: Patient Education method: Explanation, Verbal cues, and Handouts Education comprehension: verbalized understanding and returned demonstration     HOME EXERCISE PROGRAM: Access Code: [email or web address] URL: [email or web address] Date: 10/04/2021 Prepared by: Gar Ponto  Exercises - Supine Bridge  - 1-2 x daily - 7 x weekly - 1 sets - 10 reps - 10 hold - Hooklying Clamshell with Resistance  - 1-2 x daily - 7 x weekly - 1 sets - 10 reps - 5 hold - Seated Flexion Stretch with Swiss Ball  - 2 x daily - 7 x weekly - 1 sets - 3 reps - 20 hold - Seated Thoracic Flexion and Rotation with Swiss Ball  - 2 x daily - 7 x weekly - 1 sets - 3 reps - 20 hold   ASSESSMENT:   CLINICAL IMPRESSION: Pt presents to PT reporting her low back pain is better and that the HEP is helping. PT today was completed for STM/DTM to the lumbar paraspinals f/b TPDN with estim, see above. Pt tolerated the session without adverse effects. Will assess pt's response to the TPDN with estim the next PT session.   OBJECTIVE IMPAIRMENTS decreased mobility, decreased ROM, decreased strength, increased fascial restrictions, increased muscle spasms, impaired flexibility, postural dysfunction, obesity, and pain.      GOALS:   LONG TERM GOALS: Target date: 11/02/2021   Pt will be I with HEP for trunk mobility, core strength  Baseline:  Goal status: INITIAL   2.  Pt will be able to stand, walk and move as needed without pain aggravation  Baseline:  Goal status: INITIAL   3.  Pt will be able to understand body mechanics and posture for more challenging home and work tasks.  Baseline:  Goal status: INITIAL   4.  Pt will walk 3 days a week for exercise (or exercise of her choice) in order to improve overall health Baseline:  Goal status: INITIAL   5.  FOTO score will improve to 68% or better upon discharge  Baseline:  Goal status: INITIAL     PLAN: PT  FREQUENCY: 1x/week   PT DURATION: 6 weeks   PLANNED INTERVENTIONS: Therapeutic exercises, Therapeutic activity, Neuromuscular re-education, Balance training, Gait training, Patient/Family education, Joint mobilization, Electrical stimulation, Spinal mobilization, Cryotherapy, Moist heat, Manual therapy, and Re-evaluation.   PLAN FOR NEXT SESSION: check HEP, Assess pt's response to the TPDN c estim     Dasha Kawabata MS, PT 10/18/21 3:40 PM  PHYSICAL THERAPY DISCHARGE SUMMARY  Visits from Start of Care: 3  Current functional level related to goals /  functional outcomes: NA   Remaining deficits: NA   Education / Equipment: HEP    Patient agrees to discharge. Patient goals were not met. Patient is being discharged due to not returning since the last visit.  Raeford Razor, PT 01/26/22 10:51 AM Phone: [phone] Fax: [phone]

## 2021-10-18 NOTE — Patient Instructions (Signed)

## 2021-11-02 ENCOUNTER — Ambulatory Visit: Payer: BC Managed Care – PPO

## 2021-12-26 DIAGNOSIS — E119 Type 2 diabetes mellitus without complications: Secondary | ICD-10-CM | POA: Diagnosis not present

## 2021-12-26 DIAGNOSIS — Z23 Encounter for immunization: Secondary | ICD-10-CM | POA: Diagnosis not present

## 2021-12-26 DIAGNOSIS — Z0000 Encounter for general adult medical examination without abnormal findings: Secondary | ICD-10-CM | POA: Diagnosis not present

## 2021-12-26 DIAGNOSIS — Z6837 Body mass index (BMI) 37.0-37.9, adult: Secondary | ICD-10-CM | POA: Diagnosis not present

## 2021-12-26 DIAGNOSIS — E282 Polycystic ovarian syndrome: Secondary | ICD-10-CM | POA: Diagnosis not present

## 2021-12-26 DIAGNOSIS — E7800 Pure hypercholesterolemia, unspecified: Secondary | ICD-10-CM | POA: Diagnosis not present

## 2021-12-26 DIAGNOSIS — H11441 Conjunctival cysts, right eye: Secondary | ICD-10-CM | POA: Diagnosis not present

## 2021-12-26 DIAGNOSIS — M545 Low back pain, unspecified: Secondary | ICD-10-CM | POA: Diagnosis not present

## 2021-12-26 DIAGNOSIS — E559 Vitamin D deficiency, unspecified: Secondary | ICD-10-CM | POA: Diagnosis not present

## 2021-12-26 DIAGNOSIS — H02821 Cysts of right upper eyelid: Secondary | ICD-10-CM | POA: Diagnosis not present

## 2022-01-12 ENCOUNTER — Other Ambulatory Visit (HOSPITAL_COMMUNITY)
Admission: RE | Admit: 2022-01-12 | Discharge: 2022-01-12 | Disposition: A | Discharge status: Home / Self Care | Payer: BC Managed Care – PPO | Source: Ambulatory Visit | Attending: Obstetrics and Gynecology | Admitting: Obstetrics and Gynecology

## 2022-01-12 DIAGNOSIS — N920 Excessive and frequent menstruation with regular cycle: Secondary | ICD-10-CM | POA: Diagnosis not present

## 2022-01-12 DIAGNOSIS — Z01419 Encounter for gynecological examination (general) (routine) without abnormal findings: Secondary | ICD-10-CM | POA: Insufficient documentation

## 2022-01-18 LAB — CYTOLOGY - PAP
Comment: NEGATIVE
Diagnosis: NEGATIVE
Diagnosis: REACTIVE
High risk HPV: NEGATIVE

## 2022-01-24 IMAGING — MG MM DIGITAL SCREENING BILAT W/ TOMO AND CAD
6 of 10 series · 6 of 30 positions shown · non-contrast
Comparison: Previous exam(s).

CLINICAL DATA: Screening.

EXAM:
DIGITAL SCREENING BILATERAL MAMMOGRAM WITH TOMOSYNTHESIS AND CAD
TECHNIQUE: Bilateral screening digital craniocaudal and mediolateral oblique
mammograms were obtained. Bilateral screening digital breast
tomosynthesis was performed. The images were evaluated with
computer-aided detection.

[L MLO synth-2D (1 of 2)]
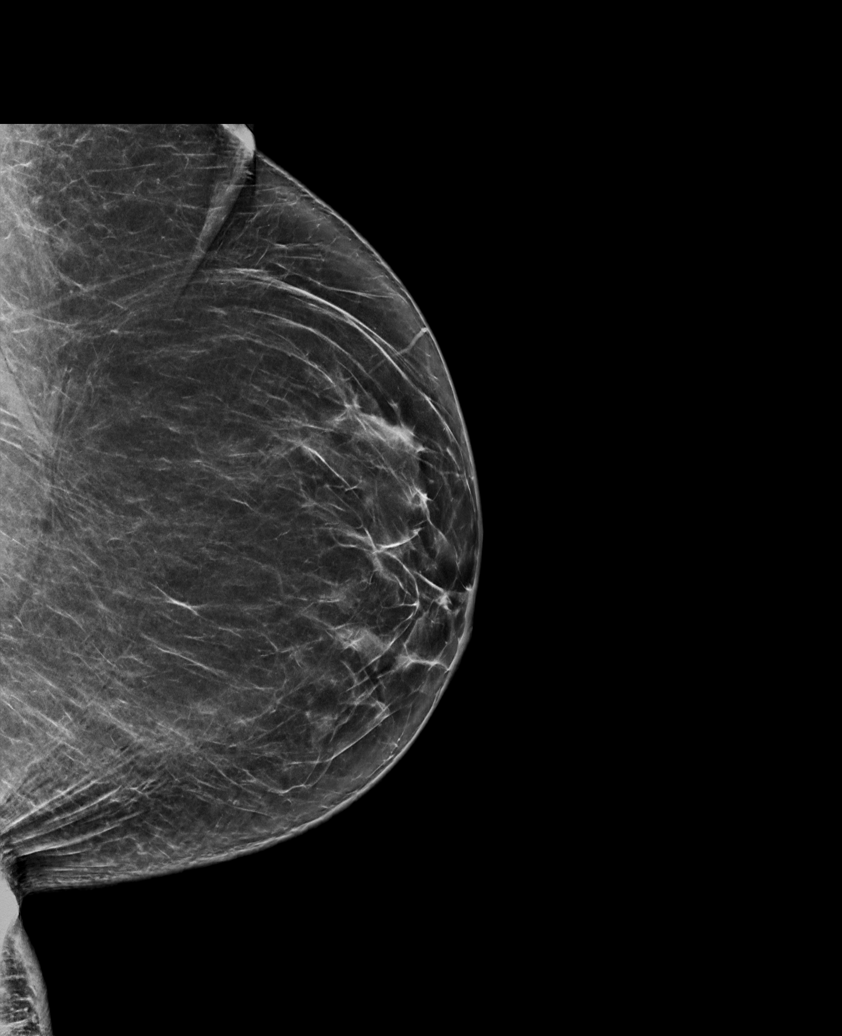

[R CC synth-2D]
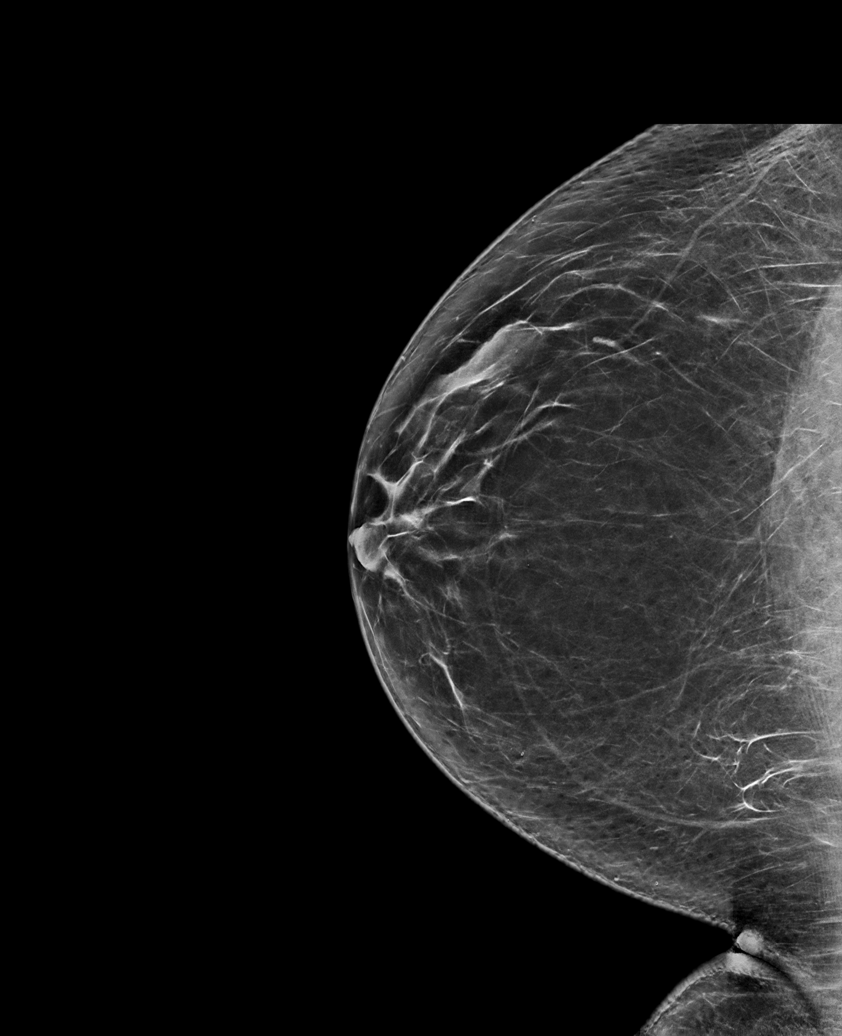

[L MLO synth-2D (2 of 2)]
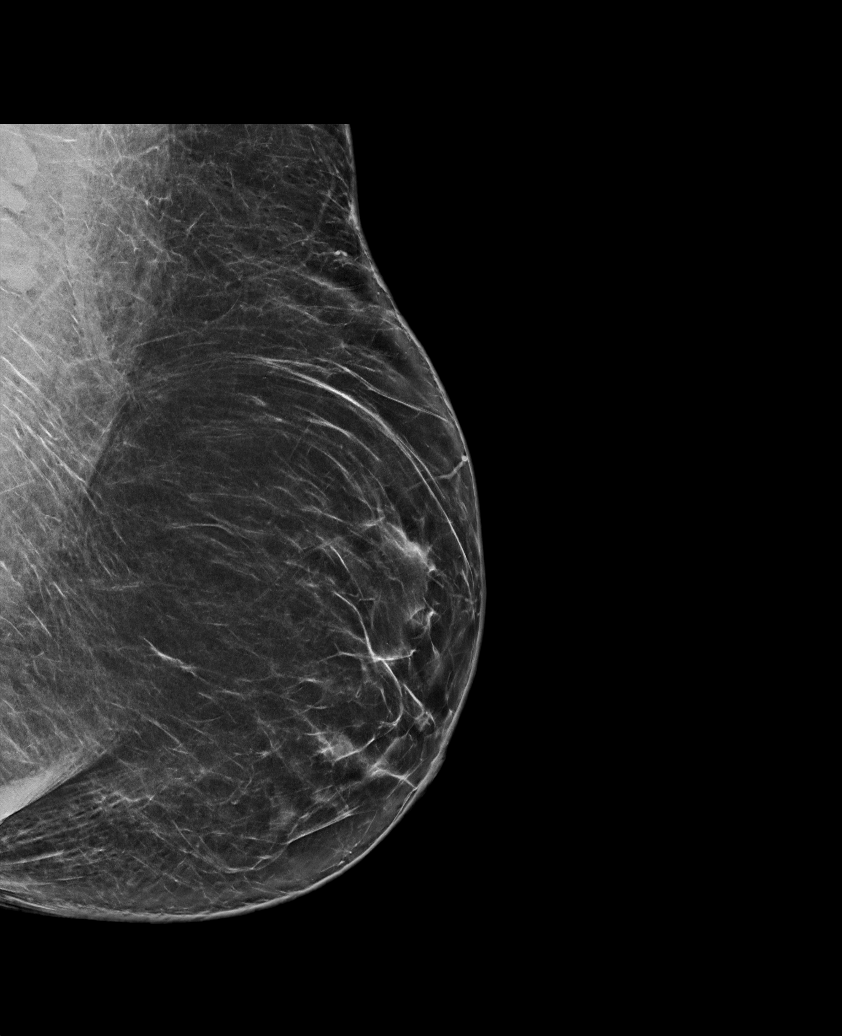

[L CC synth-2D]
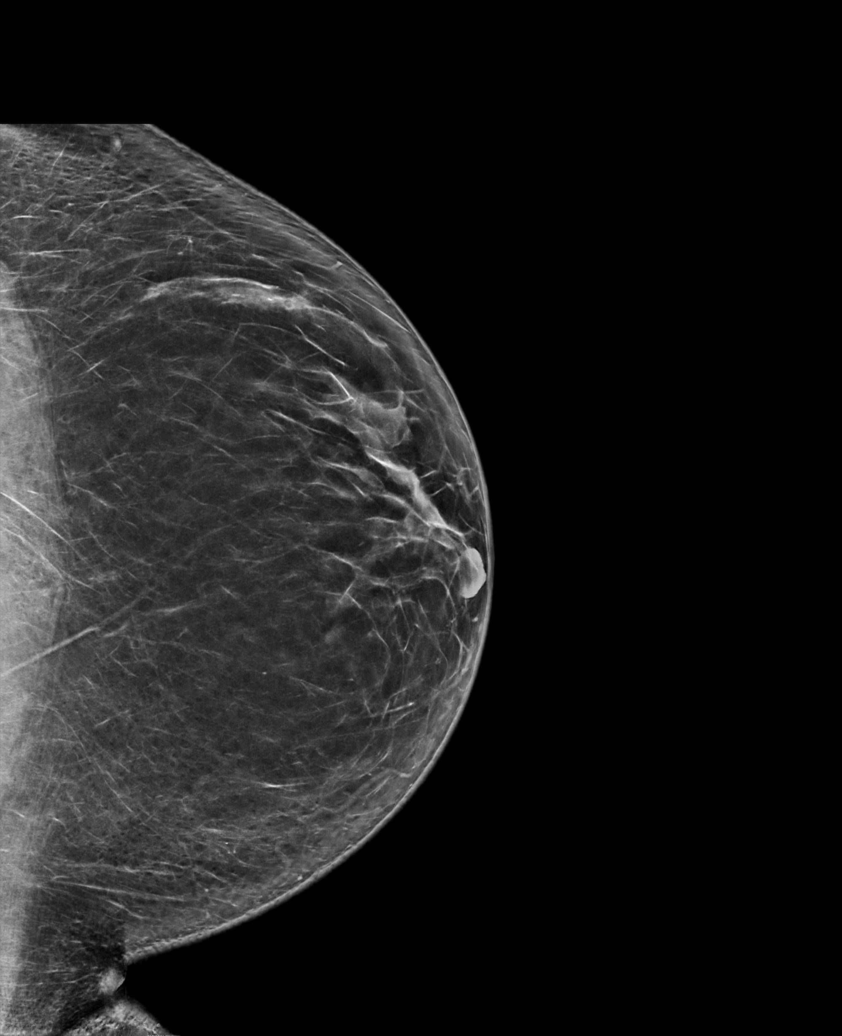

[R MLO synth-2D]
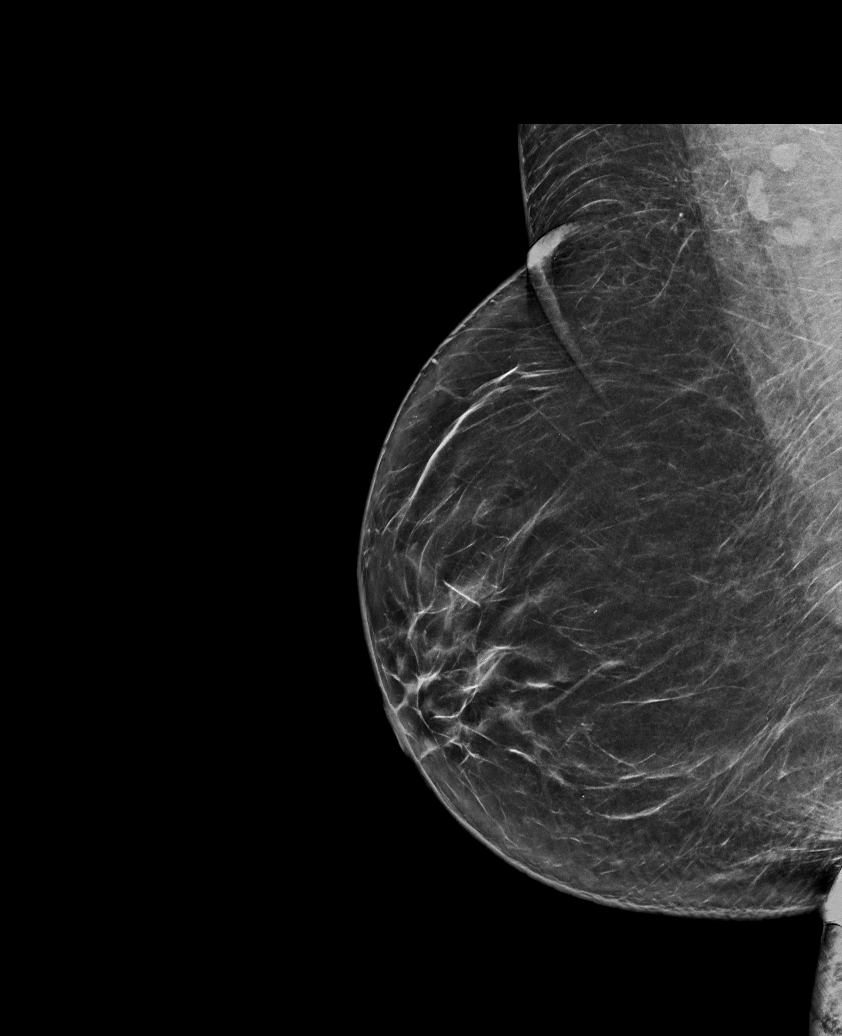

[L CC tomo · tomo slice 40/79.0]
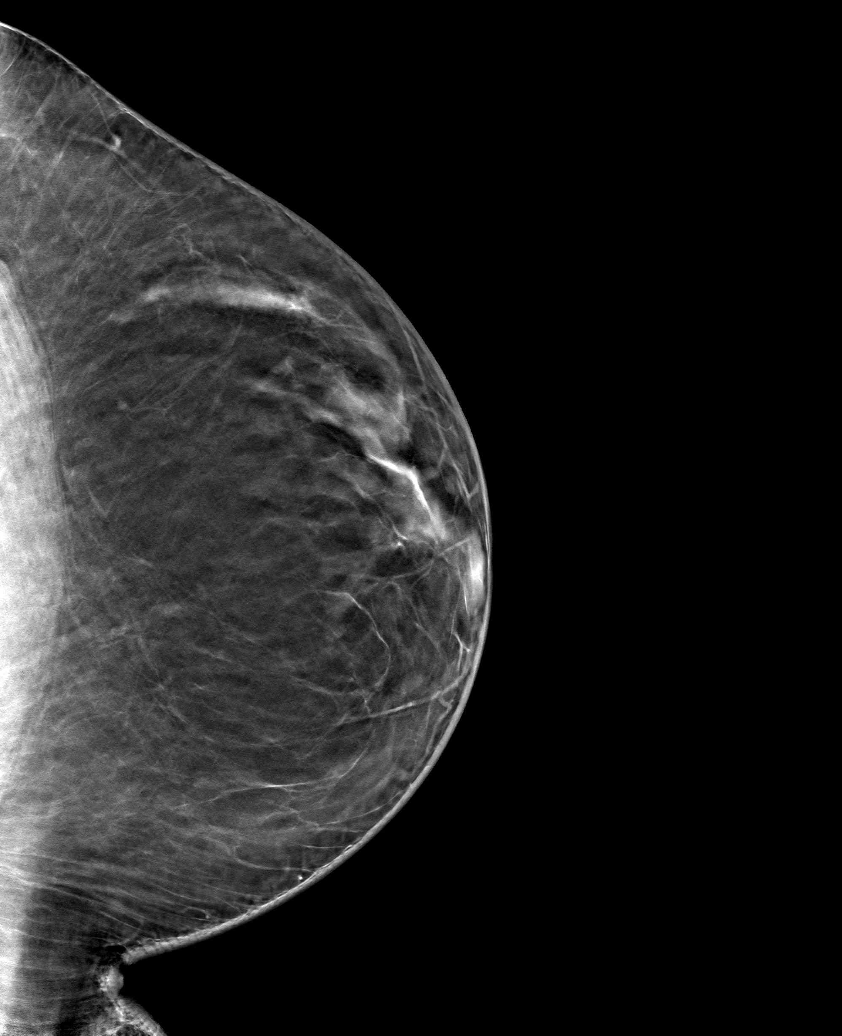

[6 of 30 positions shown; findings below may reference images not displayed]

ACR Breast Density Category b: There are scattered areas of
fibroglandular density.
FINDINGS: There are no findings suspicious for malignancy.
IMPRESSION: No mammographic evidence of malignancy. A result letter of this
screening mammogram will be mailed directly to the patient.

RECOMMENDATION:
Screening mammogram in one year. (Code:51-O-LD2)

BI-RADS CATEGORY  1: Negative.

## 2022-02-14 ENCOUNTER — Other Ambulatory Visit: Payer: Self-pay | Admitting: Obstetrics and Gynecology

## 2022-02-14 DIAGNOSIS — N920 Excessive and frequent menstruation with regular cycle: Secondary | ICD-10-CM | POA: Diagnosis not present

## 2022-02-14 DIAGNOSIS — Z3202 Encounter for pregnancy test, result negative: Secondary | ICD-10-CM | POA: Diagnosis not present

## 2022-02-14 DIAGNOSIS — R9389 Abnormal findings on diagnostic imaging of other specified body structures: Secondary | ICD-10-CM | POA: Diagnosis not present

## 2022-02-14 DIAGNOSIS — D251 Intramural leiomyoma of uterus: Secondary | ICD-10-CM | POA: Diagnosis not present

## 2022-02-14 HISTORY — PX: ENDOMETRIAL BIOPSY: SHX622

## 2022-02-17 ENCOUNTER — Other Ambulatory Visit: Payer: Self-pay | Admitting: Obstetrics and Gynecology

## 2022-02-17 DIAGNOSIS — Z1231 Encounter for screening mammogram for malignant neoplasm of breast: Secondary | ICD-10-CM

## 2022-04-13 ENCOUNTER — Ambulatory Visit
Admission: RE | Admit: 2022-04-13 | Discharge: 2022-04-13 | Disposition: A | Discharge status: Home / Self Care | Payer: BC Managed Care – PPO | Source: Ambulatory Visit | Attending: Obstetrics and Gynecology | Admitting: Obstetrics and Gynecology

## 2022-04-13 DIAGNOSIS — Z1231 Encounter for screening mammogram for malignant neoplasm of breast: Secondary | ICD-10-CM | POA: Diagnosis not present

## 2022-07-06 DIAGNOSIS — E119 Type 2 diabetes mellitus without complications: Secondary | ICD-10-CM | POA: Diagnosis not present

## 2022-07-06 DIAGNOSIS — Z5181 Encounter for therapeutic drug level monitoring: Secondary | ICD-10-CM | POA: Diagnosis not present

## 2022-07-06 DIAGNOSIS — H9202 Otalgia, left ear: Secondary | ICD-10-CM | POA: Diagnosis not present

## 2022-07-06 DIAGNOSIS — E7800 Pure hypercholesterolemia, unspecified: Secondary | ICD-10-CM | POA: Diagnosis not present

## 2022-08-11 ENCOUNTER — Other Ambulatory Visit: Payer: Self-pay

## 2022-08-11 ENCOUNTER — Encounter (HOSPITAL_BASED_OUTPATIENT_CLINIC_OR_DEPARTMENT_OTHER): Payer: Self-pay | Admitting: Obstetrics and Gynecology

## 2022-08-11 NOTE — Progress Notes (Addendum)
Spoke w/ via phone for pre-op interview---Barbara [name] needs dos---- urine pregnancy per anesthesia, surgeon orders pending as of 08/11/22             [name] results------08/28/22 [name] appt for cbc, type & screen, bmp, ekg COVID test -----patient states asymptomatic no test needed Arrive at -------0930 on Wednesday, 08/30/2022 NPO after MN NO Solid Food.  Clear liquids from MN until---0830 Med rec completed Medications to take morning of surgery -----NONE Diabetic medication -----Hold Ozempix x 1 week. Last dose before surgery should be 08/20/22. Patient instructed no nail polish to be worn day of surgery Patient instructed to bring photo id and insurance card day of surgery Patient aware to have Driver (ride ) / caregiver    for 24 hours after surgery - friend, Ruben Im Patient Special Instructions -----Extended / overnight stay instructions given. Pre-Op special Instructions -----Requested orders from Dr. Richardson Dopp on 08/11/22 via Epic IB. Patient verbalized understanding of instructions that were given at this phone interview. Patient denies shortness of breath, chest pain, fever, cough at this phone interview.  Patient takes ASA 81 mg daily. She does this on her own because she has a strong history of heart problems in her family. I instructed her not to take it on the day of surgery and to ask Dr. Richardson Dopp if she wants her to stop taking before surgery. Patient stated that she would discuss this with Dr. Richardson Dopp at her 08/18/22 pre-op appt.

## 2022-08-11 NOTE — Progress Notes (Addendum)
Your procedure is scheduled on Wednesday, 08/30/2022.  Report to [facility] AT  9:30 AM.   Call this number if you have problems the morning of surgery  :[phone].   OUR ADDRESS IS [address].  WE ARE LOCATED IN [facility].  PLEASE BRING YOUR INSURANCE CARD AND PHOTO ID DAY OF SURGERY.  ONLY 2 PEOPLE ARE ALLOWED IN  WAITING  ROOM                                      REMEMBER:  DO NOT EAT FOOD, CANDY GUM OR MINTS  AFTER MIDNIGHT THE NIGHT BEFORE YOUR SURGERY . YOU MAY HAVE CLEAR LIQUIDS FROM MIDNIGHT THE NIGHT BEFORE YOUR SURGERY UNTIL  8:30 AM. NO CLEAR LIQUIDS AFTER   8:30 AM DAY OF SURGERY.  YOU MAY  BRUSH YOUR TEETH MORNING OF SURGERY AND RINSE YOUR MOUTH OUT, NO CHEWING GUM CANDY OR MINTS.     CLEAR LIQUID DIET    Allowed      Water                                                                   Coffee and tea, regular and decaf  (NO cream or milk products of any type, may sweeten)                         Carbonated beverages, regular and diet                                    Sports drinks like Gatorade _____________________________________________________________________     TAKE ONLY THESE MEDICATIONS MORNING OF SURGERY: NONE  Last dose of Ozempic before surgery should be on 08/20/22. Do NOT take the 08/27/22 dose of Ozempic.                                        DO NOT WEAR JEWERLY/  METAL/  PIERCINGS (INCLUDING NO PLASTIC PIERCINGS) DO NOT WEAR LOTIONS, POWDERS, PERFUMES OR NAIL POLISH ON YOUR FINGERNAILS. TOENAIL POLISH IS OK TO WEAR. DO NOT SHAVE FOR 48 HOURS PRIOR TO DAY OF SURGERY.  CONTACTS, GLASSES, OR DENTURES MAY NOT BE WORN TO SURGERY.  REMEMBER: NO SMOKING, VAPING ,  DRUGS OR ALCOHOL FOR 24 HOURS BEFORE YOUR SURGERY.                                    [facility] IS NOT RESPONSIBLE  FOR ANY BELONGINGS.                                                                    Marland Kitchen  [place] -  Preparing for Surgery Before surgery, you can play an important role.  Because skin is not sterile, your skin needs to be as free of germs as possible.  You can reduce the number of germs on your skin by washing with CHG (chlorahexidine gluconate) soap before surgery.  CHG is an antiseptic cleaner which kills germs and bonds with the skin to continue killing germs even after washing. Please DO NOT use if you have an allergy to CHG or antibacterial soaps.  If your skin becomes reddened/irritated stop using the CHG and inform your nurse when you arrive at Short Stay. Do not shave (including legs and underarms) for at least 48 hours prior to the first CHG shower.  You may shave your face/neck. Please follow these instructions carefully:  1.  Shower with CHG Soap the night before surgery and the  morning of Surgery.  2.  If you choose to wash your hair, wash your hair first as usual with your  normal  shampoo.  3.  After you shampoo, rinse your hair and body thoroughly to remove the  shampoo.                                        4.  Use CHG as you would any other liquid soap.  You can apply chg directly  to the skin and wash , chg soap provided, night before and morning of your surgery.  5.  Apply the CHG Soap to your body ONLY FROM THE NECK DOWN.   Do not use on face/ open                           Wound or open sores. Avoid contact with eyes, ears mouth and genitals (private parts).                       Wash face,  Genitals (private parts) with your normal soap.             6.  Wash thoroughly, paying special attention to the area where your surgery  will be performed.  7.  Thoroughly rinse your body with warm water from the neck down.  8.  DO NOT shower/wash with your normal soap after using and rinsing off  the CHG Soap.             9.  Pat yourself dry with a clean towel.            10.  Wear clean pajamas.            11.  Place clean sheets on your bed the night of your first shower and do not   sleep with pets. Day of Surgery : Do not apply any lotions/ powders the morning of surgery.  Please wear clean clothes to the hospital/surgery center.  IF YOU HAVE ANY SKIN IRRITATION OR PROBLEMS WITH THE SURGICAL SOAP, PLEASE GET A BAR OF GOLD DIAL SOAP AND SHOWER THE NIGHT BEFORE YOUR SURGERY AND THE MORNING OF YOUR SURGERY. PLEASE LET THE NURSE KNOW MORNING OF YOUR SURGERY IF YOU HAD ANY PROBLEMS WITH THE SURGICAL SOAP.   YOUR SURGEON MAY HAVE REQUESTED EXTENDED RECOVERY TIME AFTER YOUR SURGERY. IT COULD BE A  JUST A FEW HOURS  UP TO AN OVERNIGHT STAY.  YOUR SURGEON SHOULD HAVE  DISCUSSED THIS WITH YOU PRIOR TO YOUR SURGERY. IN THE EVENT YOU NEED TO STAY OVERNIGHT PLEASE REFER TO THE FOLLOWING GUIDELINES. YOU MAY HAVE UP TO 4 VISITORS  MAY VISIT IN THE EXTENDED RECOVERY ROOM UNTIL 800 PM ONLY.  ONE  VISITOR AGE 80 AND OVER MAY SPEND THE NIGHT AND MUST BE IN EXTENDED RECOVERY ROOM NO LATER THAN 800 PM . YOUR DISCHARGE TIME AFTER YOU SPEND THE NIGHT IS 900 AM THE MORNING AFTER YOUR SURGERY. YOU MAY PACK A SMALL OVERNIGHT BAG WITH TOILETRIES FOR YOUR OVERNIGHT STAY IF YOU WISH.  REGARDLESS OF IF YOU STAY OVER NIGHT OR ARE DISCHARGED THE SAME DAY YOU WILL BE REQUIRED TO HAVE A RESPONSIBLE ADULT (18 YRS OLD OR OLDER) STAY WITH YOU FOR AT LEAST THE FIRST 24 HOURS  YOUR PRESCRIPTION MEDICATIONS WILL BE PROVIDED DURING YOUR HOSPITAL STAY.  ________________________________________________________________________                                                        QUESTIONS Mechele Claude PRE OP NURSE PHONE [phone].

## 2022-08-18 DIAGNOSIS — E119 Type 2 diabetes mellitus without complications: Secondary | ICD-10-CM | POA: Diagnosis not present

## 2022-08-18 DIAGNOSIS — D251 Intramural leiomyoma of uterus: Secondary | ICD-10-CM | POA: Diagnosis not present

## 2022-08-18 DIAGNOSIS — N920 Excessive and frequent menstruation with regular cycle: Secondary | ICD-10-CM | POA: Diagnosis not present

## 2022-08-28 ENCOUNTER — Encounter (HOSPITAL_COMMUNITY)
Admission: RE | Admit: 2022-08-28 | Discharge: 2022-08-28 | Disposition: A | Discharge status: Home / Self Care | Payer: BC Managed Care – PPO | Source: Ambulatory Visit | Attending: Obstetrics and Gynecology | Admitting: Obstetrics and Gynecology

## 2022-08-28 DIAGNOSIS — Z01818 Encounter for other preprocedural examination: Secondary | ICD-10-CM | POA: Insufficient documentation

## 2022-08-28 LAB — CBC
HCT: 37.9 % (ref 36.0–46.0)
Hemoglobin: 12.5 g/dL (ref 12.0–15.0)
MCH: 29.8 pg (ref 26.0–34.0)
MCHC: 33 g/dL (ref 30.0–36.0)
MCV: 90.2 fL (ref 80.0–100.0)
Platelets: 286 10*3/uL (ref 150–400)
RBC: 4.2 MIL/uL (ref 3.87–5.11)
RDW: 12.8 % (ref 11.5–15.5)
WBC: 8.7 10*3/uL (ref 4.0–10.5)
nRBC: 0 % (ref 0.0–0.2)

## 2022-08-28 LAB — BASIC METABOLIC PANEL
Anion gap: 8 (ref 5–15)
BUN: 13 mg/dL (ref 6–20)
CO2: 23 mmol/L (ref 22–32)
Calcium: 9 mg/dL (ref 8.9–10.3)
Chloride: 108 mmol/L (ref 98–111)
Creatinine, Ser: 0.88 mg/dL (ref 0.44–1.00)
GFR, Estimated: >60 mL/min (ref >60)
Glucose, Bld: 90 mg/dL (ref 70–99)
Potassium: 3.7 mmol/L (ref 3.5–5.1)
Sodium: 139 mmol/L (ref 135–145)

## 2022-08-29 ENCOUNTER — Other Ambulatory Visit: Payer: Self-pay | Admitting: Obstetrics and Gynecology

## 2022-08-29 DIAGNOSIS — N920 Excessive and frequent menstruation with regular cycle: Secondary | ICD-10-CM

## 2022-08-29 MED ORDER — BUPIVACAINE LIPOSOME 1.3 % IJ SUSP: 20.0000 mL | Freq: Once | INTRAMUSCULAR | Status: AC

## 2022-08-29 NOTE — H&P (Signed)
Barbara [name] for Appointment   1.  Preop/ Heavy Menses/ Fibroids History of Present Illness    General:  47 [name] presents for preop visit. Pt is schedule for a robotic assisted laparoscopic  hysterectomy with bilateral  salpingectomy on 08/30/2022 for the management of menorhhagia.  --- IN REVIEW: Her ultrasound on 02/14/2022 reveals a uterus 8.4 cm x 5.6 cm x 6.4 cm the endometrium is thickened at 2.26 cm. one fibroid is noted 1.6 cm. Her ovaries are normal bilaterally.  EMB on 02/14/2022 was benign.   she reports heavy menses. she wears 2 super tampons and changes every 2 hours. Her menses last 4-5 days. she has 2 heavy days. she reports hygiene accidents. she denies intermenstrual bleeding. Her menses have been heavy for the last 2 months.  Current Medications Taking Atorvastatin Calcium 20 MG Tablet Take 1 tablet by mouth once daily Orally Once a day , Notes to Pharmacist: pt taking differently (3x/week) Ozempic (1 MG/DOSE)(Semaglutide (1 MG/DOSE)) 4 MG/3ML Solution Pen-injector INJECT 1 MG Subcutaneous once a week valACYclovir HCl 500 MG Tablet Take 1 tablet by mouth once daily Magnesium Citrate 100 MG Capsule 1 capsule Orally once a day Vitamin C 100 MG Tablet Chewable 1 tablet Orally Once a day Vitamin D 50 MCG (2000 UT) Capsule 1 capsule Orally Once a day Aspirin 81 MG Tablet Delayed Release 1 tablet Orally Once a day Medication List reviewed and reconciled with the patient Past Medical History Dermatology follows for recurrent inflammatory skin lesions. infertility and has seen Dr. Richardson Dopp gynecology and endocrinology for this. Polycystic ovary syndrome. Diabetes. Hypercholesterolemia. Surgical History Colonoscopy repeat in 5 years. Fx Hx 07/08/2021 Family History Father: deceased, heart problem Mother: alive, Breast cancer 2012, diagnosed with Hypertension, Breast cancer, Diabetes Paternal Grand Father: deceased, unknown Paternal Grand Mother: alive, diagnosed with Diabetes,  Hypertension Maternal Grand Father: deceased, throat cancer Maternal Grand Mother: deceased, heart issues Sister 1: deceased, heart problems, diagnosed with Diabetes Sister 2: deceased, sickle cell, colon cancer Dx Fall 2019, diagnosed with Colon cancer Sister Barbara: alive, hypoglycemic, vertigo Barbara sister(s) . Mother with coronary artery disease and diabetes and hypertension; sister with diabetes and sickle trait. Social History    General:  Tobacco use  cigarettes:  Never smoked, Tobacco history last updated  08/18/2022, Vaping  No. EXPOSURE TO PASSIVE SMOKE: no. Alcohol: yes, occasional , wine, liquor. Caffeine: yes, sweet tea, Barbara-4x a week. Recreational drug use: no, no. DIET: lacking. Exercise: yes, 2-Barbara x weekly, walks. DENTAL CARE: lacking. Marital Status: married, Married. Children: none. 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 Gyn History Sexual activity not currently sexually active.  Periods : every month, heavy.  LMP 08/05/2022.  Denies H/O Birth control.  Last pap smear date 01/12/2022- neg.  Last mammogram date 03/28/2021- negative.  H/O Abnormal pap smear yes.  H/O STD HSV II.  OB History Never been pregnant  per patient.  Number of pregnancies  0.  Allergies N.K.D.A. Hospitalization/Major Diagnostic Procedure No Hospitalization History. Review of Systems    CONSTITUTIONAL:  Chills No.  Fatigue No.  Fever No.  Night sweats No.  Recent travel outside [place] No.  Sweats No.  Weight change No.     OPHTHALMOLOGY:  Blurring of vision no.  Change in vision no.  Double vision no.     ENT:  Dizziness no.  Nose bleeds no.  Sore throat no.  Teeth pain no.     ALLERGY:  Hives no.     CARDIOLOGY:  Chest  pain no.  High blood pressure no.  Irregular heart beat no.  Leg edema no.  Palpitations no.     RESPIRATORY:  Shortness of breath no.  Cough no.  Wheezing no.     UROLOGY:  Pain with urination no.  Urinary urgency no.  Urinary frequency no.  Urinary  incontinence no.  Difficulty urinating No.  Blood in urine No.     GASTROENTEROLOGY:  Abdominal pain no.  Appetite change no.  Bloating/belching no.  Blood in stool or on toilet paper no.  Change in bowel movements no.  Constipation no.  Diarrhea no.  Difficulty swallowing no.  Nausea no.     FEMALE REPRODUCTIVE:  Vulvar pain no.  Vulvar rash no.  Abnormal vaginal bleeding , heavy menses.  Breast pain no.  Nipple discharge no.  Pain with intercourse no.  Pelvic pain no.  Unusual vaginal discharge no.  Vaginal itching no.     MUSCULOSKELETAL:  Muscle aches no.     NEUROLOGY:  Headache no.  Tingling/numbness no.  Weakness no.     PSYCHOLOGY:  Depression no.  Anxiety no.  Nervousness no.  Sleep disturbances no.  Suicidal ideation no .     ENDOCRINOLOGY:  Excessive thirst no.  Excessive urination no.  Hair loss no.  Heat or cold intolerance no.     HEMATOLOGY/LYMPH:  Abnormal bleeding no.  Easy bruising no.  Swollen glands no.     DERMATOLOGY:  New/changing skin lesion no.  Rash no.  Sores no.  Vital Signs Wt: 204.0, Wt change: 2.8 lbs, Ht: 63, BMI: 36.13, Pulse sitting: 92, BP sitting: 99/67. Examination    General Examination: CONSTITUTIONAL:  alert, oriented, NAD.  SKIN:  moist, warm.  EYES:  Conjunctiva clear.  LUNGS:  good I:E efffort noted, clear to auscultation bilaterally.  HEART:  regular rate and rhythm.  ABDOMEN:  soft, non-tender/non-distended, bowel sounds present.  FEMALE GENITOURINARY:  normal external genitalia, labia - unremarkable, vagina - pink moist mucosa, no lesions or abnormal discharge, cervix - no discharge or lesions or CMT, adnexa - no masses or tenderness, uterus - nontender and normal size on palpation.  EXTREMITIES:  no edema present.  PSYCH:  affect normal, good eye contact.  Physical Examination    Chaperone present:  Chaperone present  Cleophas Dunker, [place] 08/18/2022 04:23:36 PM >, for pelvic exam.  Assessments 1. Menorrhagia with regular cycle - N92.0  (Primary) 2. Fibroids, intramural - D25.1 Barbara. Diabetes - E11.9 Treatment 1. Menorrhagia with regular cycle        Notes: planning robotic assisted laparoscopic hysterectomy with bilateral salpingectomy.: r/b/a of robotic assisted laparoscopic hysterectomy with bilateral salpingectomy discussed with the patient. including but not limited to infection/ bleeding / damage to bowel bladder ureters as well as conversion to laparotomy with the need for further surgery. pt voiced understanding and would like to proceed with robotic assisted laparoscopic hysterectomy with bilateral salpingectomy.. r/o trasnfusion discussed. HIV/ HEP B and C.. she is advised to avoid NSAIDS between now and surgery. she is advised not to eat or drink after midninght the night prior to surgery.Marland Kitchen she will not be able to drive for 1 week after surgery. she will need to avoid heavy lifting over 10 lbs and sex for at least 6 weeks after surgery   2. Fibroids, intramural        Notes: planning robotic assisted laparoscopic hysterectomy with bilateral salpingectomy.: r/b/a of robotic assisted laparoscopic hysterectomy with bilateral salpingectomy discussed with the patient. including but not  limited to infection/ bleeding / damage to bowel bladder ureters as well as conversion to laparotomy with the need for further surgery. pt voiced understanding and would like to proceed with robotic assisted laparoscopic hysterectomy with bilateral salpingectomy.. r/o trasnfusion discussed. HIV/ HEP B and C.. she is advised to avoid NSAIDS between now and surgery. she is advised not to eat or drink after midninght the night prior to surgery.Marland Kitchen she will not be able to drive for 1 week after surgery. she will need to avoid heavy lifting over 10 lbs and sex for at least 6 weeks after surgery   Barbara. Diabetes        Notes: patient advised to discontinue ozempic 2 weeks prior to surgery

## 2022-08-29 NOTE — Anesthesia Preprocedure Evaluation (Signed)
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  Reproductive/Obstetrics Uterine fibroids                             Anesthesia Physical Anesthesia Plan  ASA: 2  Anesthesia Plan: General   Post-op Pain Management: Tylenol PO (pre-op)* and Toradol IV (intra-op)*   Induction: Intravenous  PONV Risk Score and Plan: 3 and Treatment may vary due to age or medical condition, Ondansetron, Dexamethasone, Midazolam and Scopolamine patch - Pre-op  Airway Management Planned: Oral ETT  Additional Equipment: None  Intra-op Plan:   Post-operative Plan: Extubation in OR  Informed Consent:   Plan Discussed with:   Anesthesia Plan Comments:        Anesthesia Quick Evaluation

## 2022-08-29 NOTE — H&P (Deleted)
  The note originally documented on this encounter has been moved the the encounter in which it belongs.  

## 2022-08-30 ENCOUNTER — Other Ambulatory Visit: Payer: Self-pay

## 2022-08-30 ENCOUNTER — Ambulatory Visit (HOSPITAL_BASED_OUTPATIENT_CLINIC_OR_DEPARTMENT_OTHER): Payer: BC Managed Care – PPO | Admitting: Certified Registered"

## 2022-08-30 ENCOUNTER — Encounter (HOSPITAL_BASED_OUTPATIENT_CLINIC_OR_DEPARTMENT_OTHER)
Admission: RE | Disposition: A | Discharge status: Home / Self Care | Payer: Self-pay | Source: Ambulatory Visit | Attending: Obstetrics and Gynecology

## 2022-08-30 ENCOUNTER — Observation Stay (HOSPITAL_BASED_OUTPATIENT_CLINIC_OR_DEPARTMENT_OTHER)
Admission: RE | Admit: 2022-08-30 | Discharge: 2022-08-30 | Disposition: A | Discharge status: Home / Self Care | Payer: BC Managed Care – PPO | Source: Ambulatory Visit | Attending: Obstetrics and Gynecology | Admitting: Obstetrics and Gynecology

## 2022-08-30 ENCOUNTER — Encounter (HOSPITAL_BASED_OUTPATIENT_CLINIC_OR_DEPARTMENT_OTHER): Payer: Self-pay | Admitting: Obstetrics and Gynecology

## 2022-08-30 DIAGNOSIS — D251 Intramural leiomyoma of uterus: Secondary | ICD-10-CM | POA: Diagnosis not present

## 2022-08-30 DIAGNOSIS — R9389 Abnormal findings on diagnostic imaging of other specified body structures: Secondary | ICD-10-CM | POA: Diagnosis not present

## 2022-08-30 DIAGNOSIS — Z7982 Long term (current) use of aspirin: Secondary | ICD-10-CM | POA: Diagnosis not present

## 2022-08-30 DIAGNOSIS — N8500 Endometrial hyperplasia, unspecified: Secondary | ICD-10-CM | POA: Insufficient documentation

## 2022-08-30 DIAGNOSIS — E119 Type 2 diabetes mellitus without complications: Secondary | ICD-10-CM | POA: Diagnosis not present

## 2022-08-30 DIAGNOSIS — D259 Leiomyoma of uterus, unspecified: Secondary | ICD-10-CM | POA: Insufficient documentation

## 2022-08-30 DIAGNOSIS — N858 Other specified noninflammatory disorders of uterus: Secondary | ICD-10-CM | POA: Diagnosis not present

## 2022-08-30 DIAGNOSIS — Z79899 Other long term (current) drug therapy: Secondary | ICD-10-CM | POA: Insufficient documentation

## 2022-08-30 DIAGNOSIS — N920 Excessive and frequent menstruation with regular cycle: Secondary | ICD-10-CM | POA: Diagnosis not present

## 2022-08-30 DIAGNOSIS — Z01818 Encounter for other preprocedural examination: Secondary | ICD-10-CM

## 2022-08-30 HISTORY — DX: Type 2 diabetes mellitus without complications: E11.9

## 2022-08-30 HISTORY — PX: ROBOTIC ASSISTED LAPAROSCOPIC HYSTERECTOMY AND SALPINGECTOMY: SHX6379

## 2022-08-30 HISTORY — DX: Polyneuropathy, unspecified: G62.9

## 2022-08-30 LAB — TYPE AND SCREEN
ABO/RH(D): B POS
Antibody Screen: NEGATIVE

## 2022-08-30 LAB — ABO/RH: ABO/RH(D): B POS

## 2022-08-30 LAB — GLUCOSE, CAPILLARY: Glucose-Capillary: 89 mg/dL (ref 70–99)

## 2022-08-30 LAB — POCT PREGNANCY, URINE: Preg Test, Ur: NEGATIVE

## 2022-08-30 SURGERY — XI ROBOTIC ASSISTED LAPAROSCOPIC HYSTERECTOMY AND SALPINGECTOMY
Anesthesia: General | Site: Abdomen | Laterality: Bilateral

## 2022-08-30 MED ORDER — OXYCODONE HCL 5 MG PO TABS: 5.0000 mg | ORAL_TABLET | ORAL | Status: DC | PRN

## 2022-08-30 MED ORDER — ACETAMINOPHEN 500 MG PO TABS: 1000.0000 mg | ORAL_TABLET | Freq: Three times a day (TID) | ORAL | 0 refills | Status: AC | PRN

## 2022-08-30 MED ORDER — OXYCODONE HCL 5 MG PO TABS: 5.0000 mg | ORAL_TABLET | Freq: Once | ORAL | Status: DC | PRN

## 2022-08-30 MED ORDER — AMISULPRIDE (ANTIEMETIC) 5 MG/2ML IV SOLN: 10.0000 mg | Freq: Once | INTRAVENOUS | Status: DC | PRN

## 2022-08-30 MED ORDER — MIDAZOLAM HCL 2 MG/2ML IJ SOLN
INTRAMUSCULAR | Status: DC | PRN
  Administered 2022-08-30: 2 mg via INTRAVENOUS

## 2022-08-30 MED ORDER — IBUPROFEN 600 MG PO TABS: 600.0000 mg | ORAL_TABLET | Freq: Four times a day (QID) | ORAL | 1 refills | Status: AC | PRN

## 2022-08-30 MED ORDER — LACTATED RINGERS IV SOLN: INTRAVENOUS | Status: DC

## 2022-08-30 MED ORDER — SODIUM CHLORIDE 0.9 % IV SOLN
2.0000 g | INTRAVENOUS | Status: AC
  Administered 2022-08-30: 2 g via INTRAVENOUS

## 2022-08-30 MED ORDER — PROPOFOL 10 MG/ML IV BOLUS
INTRAVENOUS | Status: DC | PRN
  Administered 2022-08-30: 200 mg via INTRAVENOUS

## 2022-08-30 MED ORDER — FENTANYL CITRATE (PF) 250 MCG/5ML IJ SOLN
INTRAMUSCULAR | Status: AC
  Filled 2022-08-30: qty 5

## 2022-08-30 MED ORDER — ONDANSETRON HCL 4 MG PO TABS: 4.0000 mg | ORAL_TABLET | Freq: Four times a day (QID) | ORAL | Status: DC | PRN

## 2022-08-30 MED ORDER — GABAPENTIN 300 MG PO CAPS
300.0000 mg | ORAL_CAPSULE | ORAL | Status: AC
  Administered 2022-08-30: 300 mg via ORAL

## 2022-08-30 MED ORDER — ONDANSETRON HCL 4 MG/2ML IJ SOLN: 4.0000 mg | Freq: Four times a day (QID) | INTRAMUSCULAR | Status: DC | PRN

## 2022-08-30 MED ORDER — ALUM & MAG HYDROXIDE-SIMETH 200-200-20 MG/5ML PO SUSP: 30.0000 mL | ORAL | Status: DC | PRN

## 2022-08-30 MED ORDER — SENNA 8.6 MG PO TABS
1.0000 | ORAL_TABLET | Freq: Two times a day (BID) | ORAL | Status: DC
  Administered 2022-08-30: 8.6 mg via ORAL

## 2022-08-30 MED ORDER — PROPOFOL 10 MG/ML IV BOLUS
INTRAVENOUS | Status: AC
  Filled 2022-08-30: qty 20

## 2022-08-30 MED ORDER — SODIUM CHLORIDE 0.9 % IV SOLN
INTRAVENOUS | Status: DC | PRN
  Administered 2022-08-30: 60 mL

## 2022-08-30 MED ORDER — ZOLPIDEM TARTRATE 5 MG PO TABS: 5.0000 mg | ORAL_TABLET | Freq: Every evening | ORAL | Status: DC | PRN

## 2022-08-30 MED ORDER — SENNA 8.6 MG PO TABS
ORAL_TABLET | ORAL | Status: AC
  Filled 2022-08-30: qty 1

## 2022-08-30 MED ORDER — ACETAMINOPHEN 500 MG PO TABS
ORAL_TABLET | ORAL | Status: AC
  Filled 2022-08-30: qty 2

## 2022-08-30 MED ORDER — SIMETHICONE 80 MG PO CHEW: 80.0000 mg | CHEWABLE_TABLET | Freq: Four times a day (QID) | ORAL | Status: DC | PRN

## 2022-08-30 MED ORDER — STERILE WATER FOR IRRIGATION IR SOLN
Status: DC | PRN
  Administered 2022-08-30: 500 mL

## 2022-08-30 MED ORDER — MENTHOL 3 MG MT LOZG: 1.0000 | LOZENGE | OROMUCOSAL | Status: DC | PRN

## 2022-08-30 MED ORDER — LIDOCAINE 2% (20 MG/ML) 5 ML SYRINGE
INTRAMUSCULAR | Status: DC | PRN
  Administered 2022-08-30: 100 mg via INTRAVENOUS

## 2022-08-30 MED ORDER — ROCURONIUM BROMIDE 10 MG/ML (PF) SYRINGE
PREFILLED_SYRINGE | INTRAVENOUS | Status: DC | PRN
  Administered 2022-08-30: 40 mg via INTRAVENOUS
  Administered 2022-08-30: 50 mg via INTRAVENOUS

## 2022-08-30 MED ORDER — WHITE PETROLATUM EX OINT
TOPICAL_OINTMENT | CUTANEOUS | Status: AC
  Filled 2022-08-30: qty 5

## 2022-08-30 MED ORDER — BUPIVACAINE LIPOSOME 1.3 % IJ SUSP
INTRAMUSCULAR | Status: DC | PRN
  Administered 2022-08-30: 40 mL via SURGICAL_CAVITY

## 2022-08-30 MED ORDER — DEXMEDETOMIDINE HCL IN NACL 80 MCG/20ML IV SOLN
INTRAVENOUS | Status: DC | PRN
  Administered 2022-08-30 (×2): 8 ug via INTRAVENOUS

## 2022-08-30 MED ORDER — POVIDONE-IODINE 10 % EX SWAB
2.0000 | Freq: Once | CUTANEOUS | Status: AC
  Administered 2022-08-30: 2 via TOPICAL

## 2022-08-30 MED ORDER — ACETAMINOPHEN 500 MG PO TABS: 1000.0000 mg | ORAL_TABLET | Freq: Once | ORAL | Status: DC

## 2022-08-30 MED ORDER — ACETAMINOPHEN 500 MG PO TABS
1000.0000 mg | ORAL_TABLET | ORAL | Status: AC
  Administered 2022-08-30: 1000 mg via ORAL

## 2022-08-30 MED ORDER — OXYCODONE HCL 5 MG/5ML PO SOLN: 5.0000 mg | Freq: Once | ORAL | Status: DC | PRN

## 2022-08-30 MED ORDER — SUGAMMADEX SODIUM 200 MG/2ML IV SOLN
INTRAVENOUS | Status: DC | PRN
  Administered 2022-08-30: 200 mg via INTRAVENOUS

## 2022-08-30 MED ORDER — DEXMEDETOMIDINE HCL IN NACL 80 MCG/20ML IV SOLN
INTRAVENOUS | Status: AC
  Filled 2022-08-30: qty 20

## 2022-08-30 MED ORDER — PANTOPRAZOLE SODIUM 40 MG PO TBEC
DELAYED_RELEASE_TABLET | ORAL | Status: AC
  Filled 2022-08-30: qty 1

## 2022-08-30 MED ORDER — ONDANSETRON HCL 4 MG/2ML IJ SOLN
INTRAMUSCULAR | Status: DC | PRN
  Administered 2022-08-30: 4 mg via INTRAVENOUS

## 2022-08-30 MED ORDER — MIDAZOLAM HCL 2 MG/2ML IJ SOLN
INTRAMUSCULAR | Status: AC
  Filled 2022-08-30: qty 2

## 2022-08-30 MED ORDER — ACETAMINOPHEN 500 MG PO TABS
1000.0000 mg | ORAL_TABLET | Freq: Four times a day (QID) | ORAL | Status: DC
  Administered 2022-08-30: 1000 mg via ORAL

## 2022-08-30 MED ORDER — SCOPOLAMINE 1 MG/3DAYS TD PT72
1.0000 | MEDICATED_PATCH | Freq: Once | TRANSDERMAL | Status: DC
  Administered 2022-08-30: 1.5 mg via TRANSDERMAL

## 2022-08-30 MED ORDER — HEMOSTATIC AGENTS (NO CHARGE) OPTIME
TOPICAL | Status: DC | PRN
  Administered 2022-08-30: 1

## 2022-08-30 MED ORDER — OXYCODONE HCL 5 MG PO TABS: 5.0000 mg | ORAL_TABLET | ORAL | 0 refills | Status: AC | PRN

## 2022-08-30 MED ORDER — PANTOPRAZOLE SODIUM 40 MG PO TBEC
40.0000 mg | DELAYED_RELEASE_TABLET | Freq: Every day | ORAL | Status: DC
  Administered 2022-08-30: 40 mg via ORAL

## 2022-08-30 MED ORDER — IBUPROFEN 200 MG PO TABS: 600.0000 mg | ORAL_TABLET | Freq: Four times a day (QID) | ORAL | Status: DC

## 2022-08-30 MED ORDER — DEXAMETHASONE SODIUM PHOSPHATE 10 MG/ML IJ SOLN
INTRAMUSCULAR | Status: DC | PRN
  Administered 2022-08-30: 10 mg via INTRAVENOUS

## 2022-08-30 MED ORDER — SODIUM CHLORIDE 0.9 % IR SOLN
Status: DC | PRN
  Administered 2022-08-30: 1000 mL

## 2022-08-30 MED ORDER — FENTANYL CITRATE (PF) 100 MCG/2ML IJ SOLN: 25.0000 ug | INTRAMUSCULAR | Status: DC | PRN

## 2022-08-30 MED ORDER — PHENYLEPHRINE 80 MCG/ML (10ML) SYRINGE FOR IV PUSH (FOR BLOOD PRESSURE SUPPORT)
PREFILLED_SYRINGE | INTRAVENOUS | Status: DC | PRN
  Administered 2022-08-30: 80 ug via INTRAVENOUS
  Administered 2022-08-30 (×2): 160 ug via INTRAVENOUS
  Administered 2022-08-30 (×3): 80 ug via INTRAVENOUS

## 2022-08-30 MED ORDER — BUPIVACAINE HCL (PF) 0.25 % IJ SOLN
INTRAMUSCULAR | Status: DC | PRN
  Administered 2022-08-30: 4 mL

## 2022-08-30 MED ORDER — HYDROMORPHONE HCL 1 MG/ML IJ SOLN: 0.2000 mg | INTRAMUSCULAR | Status: DC | PRN

## 2022-08-30 MED ORDER — FENTANYL CITRATE (PF) 250 MCG/5ML IJ SOLN
INTRAMUSCULAR | Status: DC | PRN
  Administered 2022-08-30 (×2): 25 ug via INTRAVENOUS
  Administered 2022-08-30: 100 ug via INTRAVENOUS
  Administered 2022-08-30: 50 ug via INTRAVENOUS

## 2022-08-30 MED ORDER — KETOROLAC TROMETHAMINE 30 MG/ML IJ SOLN: 30.0000 mg | Freq: Once | INTRAMUSCULAR | Status: DC

## 2022-08-30 MED ORDER — PHENYLEPHRINE 80 MCG/ML (10ML) SYRINGE FOR IV PUSH (FOR BLOOD PRESSURE SUPPORT)
PREFILLED_SYRINGE | INTRAVENOUS | Status: AC
  Filled 2022-08-30: qty 20

## 2022-08-30 MED ORDER — KETOROLAC TROMETHAMINE 30 MG/ML IJ SOLN
INTRAMUSCULAR | Status: DC | PRN
  Administered 2022-08-30: 30 mg via INTRAVENOUS

## 2022-08-30 SURGICAL SUPPLY — 77 items
ADH SKN CLS APL DERMABOND .7 (GAUZE/BANDAGES/DRESSINGS) ×1
APL SRG 38 LTWT LNG FL B (MISCELLANEOUS) ×1
APPLICATOR ARISTA FLEXITIP XL (MISCELLANEOUS) IMPLANT
BAG DRN RND TRDRP ANRFLXCHMBR (UROLOGICAL SUPPLIES) ×1
BAG URINE DRAIN 2000ML AR STRL (UROLOGICAL SUPPLIES) ×1 IMPLANT
BARRIER ADHS 3X4 INTERCEED (GAUZE/BANDAGES/DRESSINGS) IMPLANT
BRR ADH 4X3 ABS CNTRL BYND (GAUZE/BANDAGES/DRESSINGS)
CANNULA CAP OBTURATR AIRSEAL 8 (CAP) IMPLANT
CATH FOLEY 2WAY SLVR 5CC 14FR (CATHETERS) IMPLANT
CATH FOLEY 3WAY 5CC 16FR (CATHETERS) ×1 IMPLANT
CAUTERY HOOK MNPLR 1.6 DVNC XI (INSTRUMENTS) IMPLANT
CELLS DAT CNTRL 66122 CELL SVR (MISCELLANEOUS) IMPLANT
COVER BACK TABLE 60X90IN (DRAPES) ×1 IMPLANT
COVER TIP SHEARS 8 DVNC (MISCELLANEOUS) ×1 IMPLANT
DEFOGGER SCOPE WARMER CLEARIFY (MISCELLANEOUS) ×1 IMPLANT
DERMABOND ADVANCED .7 DNX12 (GAUZE/BANDAGES/DRESSINGS) ×1 IMPLANT
DILATOR CANAL MILEX (MISCELLANEOUS) ×1 IMPLANT
DRAPE ARM DVNC X/XI (DISPOSABLE) ×4 IMPLANT
DRAPE COLUMN DVNC XI (DISPOSABLE) ×1 IMPLANT
DRAPE SURG IRRIG POUCH 19X23 (DRAPES) ×1 IMPLANT
DRAPE UTILITY XL STRL (DRAPES) ×1 IMPLANT
DRIVER NDL MEGA 8 DVNC XI (INSTRUMENTS) ×1 IMPLANT
DRIVER NDLE MEGA DVNC XI (INSTRUMENTS) ×1 IMPLANT
DURAPREP 26ML APPLICATOR (WOUND CARE) ×1 IMPLANT
ELECT REM PT RETURN 9FT ADLT (ELECTROSURGICAL) ×1
ELECTRODE REM PT RTRN 9FT ADLT (ELECTROSURGICAL) ×1 IMPLANT
FORCEPS BPLR LNG DVNC XI (INSTRUMENTS) ×1 IMPLANT
FORCEPS PROGRASP DVNC XI (FORCEP) IMPLANT
GAUZE 4X4 16PLY ~~LOC~~+RFID DBL (SPONGE) IMPLANT
GLOVE BIOGEL M 6.5 STRL (GLOVE) ×3 IMPLANT
GLOVE BIOGEL PI IND STRL 6.5 (GLOVE) ×3 IMPLANT
GRASPER COBRA DVNC RU (INSTRUMENTS) ×1 IMPLANT
HEMOSTAT ARISTA ABSORB 3G PWDR (HEMOSTASIS) IMPLANT
HOLDER FOLEY CATH W/STRAP (MISCELLANEOUS) IMPLANT
IRRIG SUCT STRYKERFLOW 2 WTIP (MISCELLANEOUS) ×1
IRRIGATION SUCT STRKRFLW 2 WTP (MISCELLANEOUS) ×1 IMPLANT
KIT PINK PAD W/HEAD ARE REST (MISCELLANEOUS) ×1
KIT PINK PAD W/HEAD ARM REST (MISCELLANEOUS) ×1 IMPLANT
KIT TURNOVER CYSTO (KITS) ×1 IMPLANT
LEGGING LITHOTOMY PAIR STRL (DRAPES) ×1 IMPLANT
MANIFOLD NEPTUNE II (INSTRUMENTS) ×1 IMPLANT
OBTURATOR OPTICAL STND 8 DVNC (TROCAR) ×1
OBTURATOR OPTICALSTD 8 DVNC (TROCAR) ×1 IMPLANT
OCCLUDER COLPOPNEUMO (BALLOONS) ×1 IMPLANT
PACK ROBOT WH (CUSTOM PROCEDURE TRAY) ×1 IMPLANT
PACK ROBOTIC GOWN (GOWN DISPOSABLE) ×1 IMPLANT
PAD OB MATERNITY 4.3X12.25 (PERSONAL CARE ITEMS) ×1 IMPLANT
PAD PREP 24X48 CUFFED NSTRL (MISCELLANEOUS) ×1 IMPLANT
PROTECTOR NERVE ULNAR (MISCELLANEOUS) ×1 IMPLANT
RETRACTOR WND ALEXIS 18 MED (MISCELLANEOUS) IMPLANT
RTRCTR WOUND ALEXIS 18CM MED (MISCELLANEOUS)
RTRCTR WOUND ALEXIS 18CM SML (INSTRUMENTS)
SAVER CELL AAL HAEMONETICS (INSTRUMENTS) IMPLANT
SCISSORS LAP 5X45 EPIX DISP (ENDOMECHANICALS) IMPLANT
SCISSORS MNPLR CVD DVNC XI (INSTRUMENTS) ×1 IMPLANT
SEAL UNIV 5-12 XI (MISCELLANEOUS) ×2 IMPLANT
SEALER VESSEL EXT DVNC XI (MISCELLANEOUS) IMPLANT
SET IRRIG Y TYPE TUR BLADDER L (SET/KITS/TRAYS/PACK) IMPLANT
SET TRI-LUMEN FLTR TB AIRSEAL (TUBING) IMPLANT
SET TUBE FILTERED XL AIRSEAL (SET/KITS/TRAYS/PACK) IMPLANT
SLEEVE SCD COMPRESS KNEE MED (STOCKING) ×1 IMPLANT
SOL ELECTROSURG ANTI STICK (MISCELLANEOUS) ×1
SOLUTION ELECTROSURG ANTI STCK (MISCELLANEOUS) IMPLANT
SPIKE FLUID TRANSFER (MISCELLANEOUS) ×2 IMPLANT
SUT VIC AB 0 CT1 27 (SUTURE) ×2
SUT VIC AB 0 CT1 27XBRD ANBCTR (SUTURE) ×2 IMPLANT
SUT VICRYL 0 UR6 27IN ABS (SUTURE) IMPLANT
SUT VICRYL RAPIDE 4/0 PS 2 (SUTURE) ×2 IMPLANT
SUT VLOC 180 0 9IN GS21 (SUTURE) ×1 IMPLANT
TIP RUMI ORANGE 6.7MMX12CM (TIP) IMPLANT
TIP UTERINE 5.1X6CM LAV DISP (MISCELLANEOUS) IMPLANT
TIP UTERINE 6.7X10CM GRN DISP (MISCELLANEOUS) IMPLANT
TIP UTERINE 6.7X6CM WHT DISP (MISCELLANEOUS) IMPLANT
TIP UTERINE 6.7X8CM BLUE DISP (MISCELLANEOUS) IMPLANT
TOWEL OR 17X24 6PK STRL BLUE (TOWEL DISPOSABLE) ×1 IMPLANT
TROCAR PORT AIRSEAL 8X120 (TROCAR) IMPLANT
WATER STERILE IRR 1000ML POUR (IV SOLUTION) ×1 IMPLANT

## 2022-08-30 NOTE — H&P (Signed)
Date of Initial H&P: 08/30/2022  History reviewed, patient examined, no change in status, stable for surgery.

## 2022-08-30 NOTE — Anesthesia Procedure Notes (Signed)
Procedure Name: Intubation Date/Time: 08/30/2022 11:15 AM  Performed by: Briant Sites, CRNAPre-anesthesia Checklist: Patient identified, Emergency Drugs available, Suction available and Patient being monitored Patient Re-evaluated:Patient Re-evaluated prior to induction Oxygen Delivery Method: Circle system utilized Preoxygenation: Pre-oxygenation with 100% oxygen Induction Type: IV induction Ventilation: Mask ventilation without difficulty Laryngoscope Size: Mac and 3 Grade View: Grade I Tube type: Oral Tube size: 7.0 mm Number of attempts: 1 Airway Equipment and Method: Stylet and Oral airway Placement Confirmation: ETT inserted through vocal cords under direct vision, positive ETCO2 and breath sounds checked- equal and bilateral Secured at: 20 cm Tube secured with: Tape Dental Injury: Teeth and Oropharynx as per pre-operative assessment

## 2022-08-30 NOTE — Op Note (Signed)
08/30/2022  2:09 PM  PATIENT:  Barbara [name]  47 y.o. female  PRE-OPERATIVE DIAGNOSIS:  Menorrhagia with regular cycle Fibroids Endometrial Thickening  POST-OPERATIVE DIAGNOSIS:  Menorrhagia with regular cycle Fibroids Endometrial Thickening  PROCEDURE:  Procedure(s): XI ROBOTIC ASSISTED LAPAROSCOPIC HYSTERECTOMY AND SALPINGECTOMY (Bilateral)  SURGEON:  Surgeon(s) and Role:    Gerald Leitz, MD - Primary  PHYSICIAN ASSISTANT:   ASSISTANTS: Freda Jackson assisted due to complexity of the anatomy    ANESTHESIA:   general  EBL:  15 mL   BLOOD ADMINISTERED:none  DRAINS: Urinary Catheter (Foley)   LOCAL MEDICATIONS USED:  MARCAINE    and OTHER Exparel   SPECIMEN:  Source of Specimen:  Uterus cervix and bilateral fallopian tubes   DISPOSITION OF SPECIMEN:  PATHOLOGY  COUNTS:  YES  TOURNIQUET:  * No tourniquets in log *  DICTATION: .Note written in EPIC  PLAN OF CARE: Admit for overnight observation  PATIENT DISPOSITION:  PACU - hemodynamically stable.   Delay start of Pharmacological VTE agent (>24 hrs) due to surgical blood loss or risk of bleeding: not applicable  Findings: normal appearing fallopian tubes and ovaries. Slightly Enlarged fibroid uterus.    Procedure: The patient was taken to the operating room #5 [facility] where she was placed under general anesthesia.Time out was performed. Marland Kitchen She was placed in dorsal lithotomy position and prepped and draped in the usual sterile fashion. A weighted speculum was placed into the vagina. A Deaver was placed anteriorly for retraction. The anterior lip of the cervix was grasped with a single-tooth tenaculum. The vaginal mucosa was injected with 2.5 cc of ropivacaine at the 2/4/ 8 and 10 o'clock positions. The uterus was sounded to 8 cm. the cervix was dilated to 6 mm . 0 vicryl suture placed at the 12 and 6:00 positions Of the cervix to facilitate placement of a Ru mi uterine manipulator. The manipulator was placed without  difficulty. Weighted speculum and Deaver were removed.  Attention was turned to the patient's abdomen where a 8 mm trocar was placed at  the umbilicus under direct visualization . The pneumoperitoneum was achieved with PCO2 gas. The laparoscope was removed. 50 cc of ropivacaine were injected into the abdominal cavity. The laparoscope was reinserted. An 8 mm trocar was placed in the right upper quadrant 10 centimeters from the umbilicus.(later connected to robotic arm #3). Marland Kitchen An 8 mm incision was made in the left upper quadrant 10 cm from the umbilicus and connected to robot arm #1. .   Once all ports had been placed under direct visualization.The laparoscope was removed and the da Vinci robotic system was thin right-sided docked. The robotic arms were connected to the corresponding trocars as listed above. The laparoscope was then reinserted. The long tip bipolar forceps were placed into port #1.  A vessel sealer was placed in port #3. All instruments were directed into the pelvis under direct visualization.    Attention was turned to the surgeons console.. The left mesosalpinx and left utero-ovarian ligament was cauterized and transected with the vessel sealer The broad ligament was cauterized and transected with the vessel sealer .The round ligament was cauterized and transected with the vessel sealer  The anterior leaf of broad ligament was incised along the bladder reflection to the midline.  The right  mesosalpinx and right utero-ovarian ligament was cauterized and transected with the vessel sealer. The right broad ligament was cauterized and transected with the vessel sealer. The right round ligament was cauterized and transected  with the vessel sealer The broad ligament was incised to the midline. The bladder was dissected off the lower uterine segments of the cervix via sharp and blunt dissection.   The uterine arteries were skeleton bilaterally. They were  cauterized and transected with the vessel  sealer.  The KOH ring was identified. The anterior colpotomy was performed followed by the posterior colpotomy. Once the uterus,cervix and bilateral fallopian tubes were completely excised they were removed through the vagina. The  bipolar forceps and scissors were removed and cobra forceps were placed in the port #1 and the  mega needle driver was placed in to port #3.  The vaginal cuff was closed with running suture if 0 v-lock. .Excellent hemostasis was noted. Arista was placed along the vaginal cuff.  All pelvic pedicles were examined and hemostasis was noted.  All instruments removed from the ports. All ports were removed under direct Visualization. The pneumoperitoneum was released. The skin incisions were closed with 4-0 Vicryl and then covered with Derma bond.     Sponge lap and needle counts were correct x 2. The patient was awakened from anesthesia and taken to the recovery room in stable condition.

## 2022-08-30 NOTE — Transfer of Care (Signed)
Immediate Anesthesia Transfer of Care Note  Patient: Barbara [name]  Procedure(s) Performed: XI ROBOTIC ASSISTED LAPAROSCOPIC HYSTERECTOMY AND SALPINGECTOMY (Bilateral: Abdomen)  Patient Location: PACU  Anesthesia Type:General  Level of Consciousness: drowsy and patient cooperative  Airway & Oxygen Therapy: Patient Spontanous Breathing and Patient connected to nasal cannula oxygen  Post-op Assessment: Report given to RN and Post -op Vital signs reviewed and stable  Post vital signs: Reviewed and stable  Last Vitals:  Vitals Value Taken Time  BP 116/77 08/30/22 1345  Temp    Pulse 76 08/30/22 1347  Resp 17 08/30/22 1347  SpO2 100 % 08/30/22 1347  Vitals shown include unvalidated device data.  Last Pain:  Vitals:   08/30/22 0920  TempSrc: Oral  PainSc: 0-No pain      Patients Stated Pain Goal: 6 (08/30/22 0920)  Complications: No notable events documented.

## 2022-08-30 NOTE — Discharge Summary (Signed)
Physician Discharge Summary  Patient ID: Barbara [name] MRN: [id number] DOB/AGE: Dec 23, 1975 47 y.o.  Admit date: 08/30/2022 Discharge date: 08/30/2022  Admission Diagnoses:s/p Total laparoscopic hysterectomy/ Menorrhagia  Discharge Diagnoses:  Principal Problem:   Menorrhagia   Discharged Condition: stable  Hospital Course: pt was admitted for observation after undergoing robotic assisted laparoscopic hysterectomy with bilateral salpingectomy. She did well postoperatively and is discharged home on pod #0 . Upon discharge she is tolerating po and pain is well controlled. She has return of bladder function.   Consults: None  Significant Diagnostic Studies: labs:  Recent Results (from the past 2160 hour(s))  CBC     Status: None   Collection Time: 08/28/22  3:13 PM  Result Value Ref Range   WBC 8.7 4.0 - 10.5 K/uL   RBC 4.20 3.87 - 5.11 MIL/uL   Hemoglobin 12.5 12.0 - 15.0 g/dL   HCT 51.8 84.1 - 66.0 %   MCV 90.2 80.0 - 100.0 fL   MCH 29.8 26.0 - 34.0 pg   MCHC 33.0 30.0 - 36.0 g/dL   RDW 63.0 16.0 - 10.9 %   Platelets 286 150 - 400 K/uL   nRBC 0.0 0.0 - 0.2 %    Comment: Performed at [facility], [address], [place], [place] [address]  Basic metabolic panel per protocol     Status: None   Collection Time: 08/28/22  3:13 PM  Result Value Ref Range   Sodium 139 135 - 145 mmol/L   Potassium 3.7 3.5 - 5.1 mmol/L   Chloride 108 98 - 111 mmol/L   CO2 23 22 - 32 mmol/L   Glucose, Bld 90 70 - 99 mg/dL    Comment: Glucose reference range applies only to samples taken after fasting for at least 8 hours.   BUN 13 6 - 20 mg/dL   Creatinine, Ser 7.32 0.44 - 1.00 mg/dL   Calcium 9.0 8.9 - 20.2 mg/dL   GFR, Estimated >54 >27 mL/min    Comment: (NOTE) Calculated using the CKD-EPI Creatinine Equation (2021)    Anion gap 8 5 - 15    Comment: Performed at [facility], [address] [place], [place] [address]  Type and screen [facility]     Status: None   Collection Time: 08/28/22  3:24 PM  Result Value Ref Range   ABO/RH(D) B POS    Antibody Screen NEG    Sample Expiration 09/02/2022,2359    Extend sample reason      NO TRANSFUSIONS OR PREGNANCY IN THE PAST 3 MONTHS Performed at [facility], [address], [place], [place] [address]   Pregnancy, urine POC     Status: None   Collection Time: 08/30/22  9:01 AM  Result Value Ref Range   Preg Test, Ur NEGATIVE NEGATIVE    Comment:        THE SENSITIVITY OF THIS METHODOLOGY IS >24 mIU/mL   ABO/Rh     Status: None   Collection Time: 08/30/22  9:37 AM  Result Value Ref Range   ABO/RH(D)      B POS Performed at [facility], [address], [place], [place] [address]   Glucose, capillary     Status: None   Collection Time: 08/30/22  9:37 AM  Result Value Ref Range   Glucose-Capillary 89 70 - 99 mg/dL    Comment: Glucose reference range applies only to samples taken after fasting for at least  8 hours.  Surgical pathology     Status: None   Collection Time: 08/30/22 12:52 PM  Result Value Ref Range   SURGICAL PATHOLOGY      SURGICAL PATHOLOGY CASE: [id number] PATIENT: Barbara [name] Surgical Pathology Report     Clinical History: Menorrhagia with regular cycle, fibroids, endometrial thickening (crm)     FINAL MICROSCOPIC DIAGNOSIS:  A. UTERUS, CERVIX, BILATERAL FALLOPIAN TUBES, HYSTERECTOMY AND SALPINGECTOMY: -  Unremarkable cervix, negative for dysplasia. -  Mixed phase endometrium (proliferative and secretory) with areas of thick-walled vessels consistent with polyp(s), negative for atypia/hyperplasia. -  Myometrium with benign leiomyomata. -  Unremarkable bilateral fallopian tubes.    GROSS DESCRIPTION:  Specimen: Uterus including cervix and bilateral fallopian tubes, received fresh. Specimen integrity: Intact Size and shape: The uterine body is symmetrical, 6.5 x 6 x 5.6  cm. Weight: 122 g without adnexa Serosa: Pink to hyperemic, scattered granularity. Cervix: 3.2 cm in length, 3.4 cm in diameter, has a pink-white smooth ectocervix and smooth endocervix. Endomet rium: The endometrial cavity is 4.5 x 3.2 cm and the endometrial surface is diffusely involved with tan-pink to pink-red soft polypoid thickening up to 0.5 cm thick.  There is also a definite 2.6 x 1 x 0.5 cm pink-red soft endometrial polyp. Myometrium: Tan-pink, has scattered vague nodularity and is up to 3.1 cm thick.  Within the fundus is a 1.1 cm well-defined intramural nodule with pink-white whorled cut surfaces. Right adnexa: The right fallopian tube is a 5.6 cm in length and up to 0.8 cm in diameter fimbriated segment which has a pink-purple smooth serosa and unremarkable cut surfaces. Left adnexa: The left fallopian tube is a 6.4 cm in length and up to 0.8 cm in diameter fimbriated segment which has a pink-purple smooth serosa and unremarkable cut surfaces. Block Summary: Block 1 = uterine serosa Block 2 = cervix Blocks 3-5 = endomyometrium including polypoid endometrial thickening Blocks 6, 7 = endometrial polyp including base, entirely submitted Block 8 = se ction of intramural nodule Block 9 = representative sections of the right fallopian tube, to include 1 cross-section and fimbriated end bisected lengthwise Block 10 = representative sections of left fallopian tube, to include 1 cross-section and fimbriated end bisected lengthwise  SW 08/30/2022    Final Diagnosis performed by Orene Desanctis DO.   Electronically signed 08/31/2022 Technical and / or Professional components performed at [facility], [address], [place], [place] [address].  Immunohistochemistry Technical component (if applicable) was performed at [facility]. [address], [place], [place] [address].   IMMUNOHISTOCHEMISTRY DISCLAIMER (if applicable): Some of  these immunohistochemical stains may have been developed and the performance characteristics determine by [facility]. Some may not have been cleared or approved by the U.S. Food and Drug Administration. The FDA has determined that such  clearance or approval is not necessary. This test is used for clinical purposes. It should not be regarded as investigational or for research. This laboratory is certified under the Clinical Laboratory Improvement Amendments of 1988 (CLIA-88) as qualified to perform high complexity clinical laboratory testing.  The controls stained appropriately.   IHC stains are performed on formalin fixed, paraffin embedded tissue using a 3,3"diaminobenzidine (DAB) chromogen and Leica Bond Autostainer System. The staining intensity of the nucleus is score manually and is reported as the percentage of tumor cell nuclei demonstrating specific nuclear staining. The specimens are fixed in 10% Neutral Formalin for at least 6 hours and up to 72hrs.  These tests are validated on decalcified tissue. Results should be interpreted with caution given the possibility of false negative results on decalcified specimens. Antibody Clones are as follows ER-clone [id number], PR-clone 16, Ki67- clone MM1. Some of  these immunohistochemical stains may have been developed and the performance characteristics determined by [facility].      Treatments: surgery: robotic assisted laparoscopic hysterectomy with bilateral salpingectomy  Discharge Exam: Blood pressure 119/88, pulse 82, temperature 97.7 F (36.5 C), resp. rate 15, height 5' 3.5" (1.613 m), weight 92.5 kg, last menstrual period 08/05/2022, SpO2 100 %. General appearance: alert, cooperative, and no distress GI: soft appropriately tender nondistended  Extremities: extremities normal, atraumatic, no cyanosis or edema Incision/Wound: well approximated no erythema or exudate   Disposition: Discharge disposition: 01-Home  or Self Care       Discharge Instructions     Call MD for:  persistant nausea and vomiting   Complete by: As directed    Call MD for:  redness, tenderness, or signs of infection (pain, swelling, redness, odor or green/yellow discharge around incision site)   Complete by: As directed    Call MD for:  severe uncontrolled pain   Complete by: As directed    Call MD for:  temperature >100.4   Complete by: As directed    Diet general   Complete by: As directed    Driving Restrictions   Complete by: As directed    Avoid driving for 1 week   Increase activity slowly   Complete by: As directed    Lifting restrictions   Complete by: As directed    Avoid lifting over 10 lbs  for 6 weeks and until approved by Dr. Richardson Dopp   May shower / Bathe   Complete by: As directed    May walk up steps   Complete by: As directed    No wound care   Complete by: As directed    Sexual Activity Restrictions   Complete by: As directed    Avoid sex for 6 weeks and until approved by Dr. Richardson Dopp      Allergies as of 08/30/2022   No Known Allergies      Medication List     TAKE these medications    acetaminophen 500 MG tablet Commonly known as: TYLENOL Take 2 tablets (1,000 mg total) by mouth every 8 (eight) hours as needed for moderate pain or mild pain.   aspirin EC 81 MG tablet Take 81 mg by mouth daily. Swallow whole.   atorvastatin 20 MG tablet Commonly known as: LIPITOR Take 20 mg by mouth at bedtime. Takes 3 times per week.   CINNAMON PO Take 1 capsule by mouth at bedtime.   Fish Oil 300 MG Caps Take by mouth.   ibuprofen 600 MG tablet Commonly known as: ADVIL Take 1 tablet (600 mg total) by mouth every 6 (six) hours as needed for moderate pain, mild pain or cramping.   Magnesium Citrate 100 MG Caps Take by mouth.   oxyCODONE 5 MG immediate release tablet Commonly known as: Oxy IR/ROXICODONE Take 1 tablet (5 mg total) by mouth every 4 (four) hours as needed for moderate  pain.   Ozempic (1 MG/DOSE) 4 MG/3ML Sopn Generic drug: Semaglutide (1 MG/DOSE) Inject into the skin.   valACYclovir 500 MG tablet Commonly known as: VALTREX Take 500 mg by mouth daily.   vitamin C 100 MG tablet Take 100 mg by mouth daily.   vitamin D3 50 MCG (2000 UT) Caps Take  by mouth at bedtime.        Follow-up Information     Gerald Leitz, MD. Go in 2 week(s).   Specialty: Obstetrics and Gynecology Contact information: [address] [place] [address] [phone]                 Signed: Gerald Leitz 08/30/2022, 4:28 PM

## 2022-08-30 NOTE — Discharge Instructions (Signed)

## 2022-08-30 NOTE — Progress Notes (Signed)
Attending Physician (Dr. Richardson Dopp) phoned, requested we call patient and ask for pt to arrive at 0900hrs day of surgery, personally spoke with client and informed her that her MD as asked her to arrive at 0900 hrs in PreOp for her surgery. Reminded client to be NPO after midnight. Opportunity for questions or clarifications prior to ending phone call.

## 2022-08-30 NOTE — Anesthesia Postprocedure Evaluation (Signed)
Anesthesia Post Note  Patient: Barbara [name]  Procedure(s) Performed: XI ROBOTIC ASSISTED LAPAROSCOPIC HYSTERECTOMY AND SALPINGECTOMY (Bilateral: Abdomen)     Patient location during evaluation: PACU Anesthesia Type: General Level of consciousness: awake and alert Pain management: pain level controlled Vital Signs Assessment: post-procedure vital signs reviewed and stable Respiratory status: spontaneous breathing, nonlabored ventilation and respiratory function stable Cardiovascular status: blood pressure returned to baseline Postop Assessment: no apparent nausea or vomiting Anesthetic complications: no   No notable events documented.  Last Vitals:  Vitals:   08/30/22 1430 08/30/22 1445  BP: 109/71 105/79  Pulse:  72  Resp:  15  Temp: 37 C 37.1 C  SpO2:  100%    Last Pain:  Vitals:   08/30/22 1445  TempSrc:   PainSc: 0-No pain                 Shanda Howells

## 2022-08-31 ENCOUNTER — Encounter (HOSPITAL_BASED_OUTPATIENT_CLINIC_OR_DEPARTMENT_OTHER): Payer: Self-pay | Admitting: Obstetrics and Gynecology

## 2022-08-31 LAB — SURGICAL PATHOLOGY

## 2022-12-01 DIAGNOSIS — H6503 Acute serous otitis media, bilateral: Secondary | ICD-10-CM | POA: Diagnosis not present

## 2022-12-01 DIAGNOSIS — Z6835 Body mass index (BMI) 35.0-35.9, adult: Secondary | ICD-10-CM | POA: Diagnosis not present

## 2022-12-01 DIAGNOSIS — E669 Obesity, unspecified: Secondary | ICD-10-CM | POA: Diagnosis not present

## 2022-12-01 DIAGNOSIS — B3731 Acute candidiasis of vulva and vagina: Secondary | ICD-10-CM | POA: Diagnosis not present

## 2022-12-27 ENCOUNTER — Ambulatory Visit
Admission: RE | Admit: 2022-12-27 | Discharge: 2022-12-27 | Disposition: A | Discharge status: Home / Self Care | Payer: BC Managed Care – PPO | Source: Ambulatory Visit | Attending: Family Medicine | Admitting: Family Medicine

## 2022-12-27 ENCOUNTER — Other Ambulatory Visit: Payer: Self-pay | Admitting: Family Medicine

## 2022-12-27 DIAGNOSIS — M79672 Pain in left foot: Secondary | ICD-10-CM

## 2022-12-27 DIAGNOSIS — E559 Vitamin D deficiency, unspecified: Secondary | ICD-10-CM | POA: Diagnosis not present

## 2022-12-27 DIAGNOSIS — Z0000 Encounter for general adult medical examination without abnormal findings: Secondary | ICD-10-CM | POA: Diagnosis not present

## 2022-12-27 DIAGNOSIS — Z23 Encounter for immunization: Secondary | ICD-10-CM | POA: Diagnosis not present

## 2022-12-27 DIAGNOSIS — E119 Type 2 diabetes mellitus without complications: Secondary | ICD-10-CM | POA: Diagnosis not present

## 2023-07-09 DIAGNOSIS — Z6836 Body mass index (BMI) 36.0-36.9, adult: Secondary | ICD-10-CM | POA: Diagnosis not present

## 2023-07-09 DIAGNOSIS — E7800 Pure hypercholesterolemia, unspecified: Secondary | ICD-10-CM | POA: Diagnosis not present

## 2023-07-09 DIAGNOSIS — E119 Type 2 diabetes mellitus without complications: Secondary | ICD-10-CM | POA: Diagnosis not present

## 2023-08-22 DIAGNOSIS — J029 Acute pharyngitis, unspecified: Secondary | ICD-10-CM | POA: Diagnosis not present

## 2023-08-22 DIAGNOSIS — R051 Acute cough: Secondary | ICD-10-CM | POA: Diagnosis not present

## 2024-01-10 DIAGNOSIS — E7800 Pure hypercholesterolemia, unspecified: Secondary | ICD-10-CM | POA: Diagnosis not present

## 2024-01-10 DIAGNOSIS — Z0000 Encounter for general adult medical examination without abnormal findings: Secondary | ICD-10-CM | POA: Diagnosis not present

## 2024-01-10 DIAGNOSIS — E119 Type 2 diabetes mellitus without complications: Secondary | ICD-10-CM | POA: Diagnosis not present

## 2024-01-10 DIAGNOSIS — Z01419 Encounter for gynecological examination (general) (routine) without abnormal findings: Secondary | ICD-10-CM | POA: Diagnosis not present

## 2024-01-10 DIAGNOSIS — Z23 Encounter for immunization: Secondary | ICD-10-CM | POA: Diagnosis not present

## 2024-01-23 ENCOUNTER — Other Ambulatory Visit: Payer: Self-pay | Admitting: Obstetrics and Gynecology

## 2024-01-23 DIAGNOSIS — Z1231 Encounter for screening mammogram for malignant neoplasm of breast: Secondary | ICD-10-CM

## 2024-01-28 ENCOUNTER — Ambulatory Visit
Admission: RE | Admit: 2024-01-28 | Discharge: 2024-01-28 | Disposition: A | Source: Ambulatory Visit | Attending: Obstetrics and Gynecology | Admitting: Obstetrics and Gynecology

## 2024-01-28 DIAGNOSIS — Z1231 Encounter for screening mammogram for malignant neoplasm of breast: Secondary | ICD-10-CM

## 2024-01-30 ENCOUNTER — Other Ambulatory Visit: Payer: Self-pay | Admitting: Obstetrics and Gynecology

## 2024-01-30 DIAGNOSIS — R928 Other abnormal and inconclusive findings on diagnostic imaging of breast: Secondary | ICD-10-CM

## 2024-02-04 ENCOUNTER — Ambulatory Visit
Admission: RE | Admit: 2024-02-04 | Discharge: 2024-02-04 | Disposition: A | Discharge status: Home / Self Care | Source: Ambulatory Visit | Attending: Obstetrics and Gynecology | Admitting: Obstetrics and Gynecology

## 2024-02-04 ENCOUNTER — Ambulatory Visit
Admission: RE | Admit: 2024-02-04 | Discharge: 2024-02-04 | Disposition: A | Source: Ambulatory Visit | Attending: Obstetrics and Gynecology | Admitting: Obstetrics and Gynecology

## 2024-02-04 ENCOUNTER — Other Ambulatory Visit: Payer: Self-pay | Admitting: Obstetrics and Gynecology

## 2024-02-04 DIAGNOSIS — N6489 Other specified disorders of breast: Secondary | ICD-10-CM | POA: Diagnosis not present

## 2024-02-04 DIAGNOSIS — R928 Other abnormal and inconclusive findings on diagnostic imaging of breast: Secondary | ICD-10-CM

## 2024-09-04 ENCOUNTER — Encounter
